# Patient Record
Sex: Male | Born: 1955 | Race: White | Hispanic: No | Marital: Married | Smoking: Never smoker
Health system: Southern US, Community
[De-identification: ages and names within clinical notes are randomized; demographics above are authoritative.]

## PROBLEM LIST (undated history)

## (undated) DIAGNOSIS — I10 Essential (primary) hypertension: Secondary | ICD-10-CM

## (undated) DIAGNOSIS — F329 Major depressive disorder, single episode, unspecified: Secondary | ICD-10-CM

## (undated) DIAGNOSIS — E7800 Pure hypercholesterolemia, unspecified: Secondary | ICD-10-CM

## (undated) DIAGNOSIS — R1031 Right lower quadrant pain: Secondary | ICD-10-CM

## (undated) DIAGNOSIS — F32A Depression, unspecified: Secondary | ICD-10-CM

## (undated) HISTORY — DX: Pure hypercholesterolemia, unspecified: E78.00

## (undated) HISTORY — DX: Essential (primary) hypertension: I10

## (undated) HISTORY — DX: Depression, unspecified: F32.A

## (undated) HISTORY — PX: TONSILLECTOMY: SUR1361

## (undated) HISTORY — PX: LIPOMA EXCISION: SHX5283

## (undated) HISTORY — DX: Right lower quadrant pain: R10.31

## (undated) HISTORY — DX: Major depressive disorder, single episode, unspecified: F32.9

---

## 2000-06-16 ENCOUNTER — Emergency Department (HOSPITAL_COMMUNITY): Admission: EM | Admit: 2000-06-16 | Discharge: 2000-06-16 | Payer: Self-pay | Admitting: *Deleted

## 2006-07-11 HISTORY — PX: MANDIBLE SURGERY: SHX707

## 2011-04-27 ENCOUNTER — Ambulatory Visit (HOSPITAL_COMMUNITY)
Admission: RE | Admit: 2011-04-27 | Discharge: 2011-04-27 | Disposition: A | Discharge status: Home / Self Care | Payer: Managed Care, Other (non HMO) | Source: Ambulatory Visit | Attending: Emergency Medicine | Admitting: Emergency Medicine

## 2011-04-27 ENCOUNTER — Other Ambulatory Visit: Payer: Self-pay | Admitting: Emergency Medicine

## 2011-04-27 DIAGNOSIS — N4400 Torsion of testis, unspecified: Secondary | ICD-10-CM

## 2011-04-27 DIAGNOSIS — N508 Other specified disorders of male genital organs: Secondary | ICD-10-CM | POA: Insufficient documentation

## 2011-04-27 DIAGNOSIS — N509 Disorder of male genital organs, unspecified: Secondary | ICD-10-CM | POA: Insufficient documentation

## 2011-06-08 ENCOUNTER — Encounter (INDEPENDENT_AMBULATORY_CARE_PROVIDER_SITE_OTHER): Payer: Self-pay | Admitting: General Surgery

## 2011-06-16 ENCOUNTER — Ambulatory Visit (INDEPENDENT_AMBULATORY_CARE_PROVIDER_SITE_OTHER): Payer: Managed Care, Other (non HMO) | Admitting: General Surgery

## 2011-06-16 ENCOUNTER — Encounter (INDEPENDENT_AMBULATORY_CARE_PROVIDER_SITE_OTHER): Payer: Self-pay | Admitting: General Surgery

## 2011-06-16 VITALS — BP 138/82 | HR 60 | Temp 97.6°F | Resp 18 | Ht 69.0 in | Wt 163.1 lb

## 2011-06-16 DIAGNOSIS — IMO0002 Reserved for concepts with insufficient information to code with codable children: Secondary | ICD-10-CM

## 2011-06-16 DIAGNOSIS — S76219A Strain of adductor muscle, fascia and tendon of unspecified thigh, initial encounter: Secondary | ICD-10-CM

## 2011-06-16 DIAGNOSIS — K429 Umbilical hernia without obstruction or gangrene: Secondary | ICD-10-CM | POA: Insufficient documentation

## 2011-06-16 MED ORDER — IBUPROFEN 800 MG PO TABS: 800.0000 mg | ORAL_TABLET | Freq: Three times a day (TID) | ORAL | Status: AC

## 2011-06-16 NOTE — Patient Instructions (Signed)
Inguinal Strain Your exam shows you have an inguinal strain. This is also known as a pulled groin. This injury is usually due to a pull or partial tear to a muscle or tendon in the groin area. Most groin pulls take several weeks to heal completely. There may be pain with lifting your leg or walking during much of your recovery. Treatment for groin strains includes:  Rest and avoid lifting or performing activities that increase your pain.   Apply ice packs for 20-30 minutes every few hours to reduce pain and swelling over the next 2-3 days.   Medicine to reduce pain and inflammation is often prescribed.  HOME CARE INSTRUCTIONS  While most strains in the groin area will heal with rest, you should also watch for any signs of a more serious condition.  SEEK IMMEDIATE MEDICAL CARE IF:   You notice unusual swelling or bulging in the groin.   You have pain or swelling in the testicle.   Blood in your urine.   Marked increased pain.   Weakness or numbness of your leg or abdominal pain.  MAKE SURE YOU:   Understand these instructions.   Will watch your condition.   Will get help right away if you are not doing well or get worse.  Document Released: 08/04/2004 Document Revised: 03/09/2011 Document Reviewed: 11/01/2007 [facility] Patient Information 2012 [place], [place].  Diverticulosis Diverticulosis is a common condition that develops when small pouches (diverticula) form in the wall of the colon. The risk of diverticulosis increases with age. It happens more often in people who eat a low-fiber diet. Most individuals with diverticulosis have no symptoms. Those individuals with symptoms usually experience abdominal pain, constipation, or loose stools (diarrhea). HOME CARE INSTRUCTIONS   Increase the amount of fiber in your diet as directed by your caregiver or dietician. This may reduce symptoms of diverticulosis.   Your caregiver may recommend taking a dietary fiber supplement.   Drink at  least 6 to 8 glasses of water each day to prevent constipation.   Try not to strain when you have a bowel movement.   Your caregiver may recommend avoiding nuts and seeds to prevent complications, although this is still an uncertain benefit.   Only take over-the-counter or prescription medicines for pain, discomfort, or fever as directed by your caregiver.  FOODS WITH HIGH FIBER CONTENT INCLUDE:  Fruits. Apple, peach, pear, tangerine, raisins, prunes.   Vegetables. Brussels sprouts, asparagus, broccoli, cabbage, carrot, cauliflower, romaine lettuce, spinach, summer squash, tomato, winter squash, zucchini.   Starchy Vegetables. Baked beans, kidney beans, lima beans, split peas, lentils, potatoes (with skin).   Grains. Whole wheat bread, brown rice, bran flake cereal, plain oatmeal, white rice, shredded wheat, bran muffins.  SEEK IMMEDIATE MEDICAL CARE IF:   You develop increasing pain or severe bloating.   You have an oral temperature above 102 F (38.9 C), not controlled by medicine.   You develop vomiting or bowel movements that are bloody or black.  Document Released: 03/24/2004 Document Revised: 03/09/2011 Document Reviewed: 11/25/2009 [facility] Patient Information 2012 [place], [place].

## 2011-06-16 NOTE — Progress Notes (Signed)
Patient ID: William Nunez, male   DOB: 03-04-1956, 55 y.o.   MRN: [id number]  Chief Complaint  Patient presents with  . New Evaluation    eval of groin pain possible hernia     HPI William Nunez is a 55 y.o. male.  HPI 55 year old Caucasian male referred by his doctor for evaluation of right inguinal pain. The patient states that he started having problems in October 2012. He had intercourse with his wife and the following morning he noticed right inguinal and testicular discomfort. He went to an urgent care. He was told he did not have testicular torsion. There was concerns for possible epididymitis. He was placed on antibiotics. A subsequent ultrasound was done which was negative except for epididymal cyst. He did not have any relief of his discomfort with the doxycycline. He ended up seeing his primary care physician as well. He was placed on meloxicam without any relief. At this point he went back to the urgent care center where he states x-rays as well as a CT scan were ordered. There was evidence of gallstones, a small umbilical hernia, and no mention of an inguinal hernia. He was placed on gabapentin. He states that since he has started taking the gabapentin his discomfort has improved but not resolved.  He describes the discomfort as a sharp burning pain. It is constant. Initially it was a 7/10. He did have some numbness in his right anterior thigh as well. He has tried to avoid heavy activities. He did get some improvement with gabapentin.  He denies any abdominal pain, bloating, nausea, postprandial pain.  He has questions about gallstones and diverticulosis.   Past Medical History  Diagnosis Date  . Hypertension   . Hypercholesteremia     Past Surgical History  Procedure Date  . Tonsillectomy   . Lipoma excision     several  . Mandible surgery 2008    removal polyp    Family History  Problem Relation Age of Onset  . Heart attack Father   . Stroke Mother   .  Diabetes Mother     Social History History  Substance Use Topics  . Smoking status: Never Smoker   . Smokeless tobacco: Never Used  . Alcohol Use: No    Allergies  Allergen Reactions  . Hycodan (Hydrocodone-Homatropine)   . Lisinopril   . Prozac (Fluoxetine Hcl)     Current Outpatient Prescriptions  Medication Sig Dispense Refill  . fish oil-omega-3 fatty acids 1000 MG capsule Take 2 g by mouth daily.        Marland Kitchen gabapentin (NEURONTIN) 300 MG capsule       . losartan (COZAAR) 25 MG tablet Take 25 mg by mouth daily.        . Multiple Vitamins-Minerals (MULTIVITAMIN PO) Take by mouth daily.        Marland Kitchen ibuprofen (MOTRIN IB) 800 MG tablet Take 1 tablet (800 mg total) by mouth 3 (three) times daily.  30 tablet  0    Review of Systems Review of Systems  Constitutional: Negative for fever, chills, appetite change, fatigue and unexpected weight change.  HENT: Negative for nosebleeds, neck pain and neck stiffness.   Eyes: Negative for photophobia and visual disturbance.       +glasses  Respiratory: Negative for chest tightness, shortness of breath and wheezing.   Cardiovascular: Negative for chest pain and palpitations.  Gastrointestinal: Negative for nausea, abdominal pain, diarrhea, constipation and abdominal distention.  Genitourinary: Negative for dysuria, urgency, hematuria,  decreased urine volume, discharge, difficulty urinating and penile pain.       +nocturia  Musculoskeletal: Negative for joint swelling and gait problem.  Skin: Negative.   Neurological: Negative.   Hematological: Negative.   Psychiatric/Behavioral: Negative.     Blood pressure 138/82, pulse 60, temperature 97.6 F (36.4 C), temperature source Temporal, resp. rate 18, height 5\' 9"  (1.753 m), weight 163 lb 2 oz (73.993 kg).  Physical Exam Physical Exam  Vitals reviewed. Constitutional: He is oriented to person, place, and time. He appears well-developed and well-nourished. No distress.  HENT:  Head:  Normocephalic and atraumatic.  Eyes: Conjunctivae are normal. No scleral icterus.  Neck: Neck supple. No tracheal deviation present. No thyromegaly present.  Cardiovascular: Normal rate, regular rhythm and normal heart sounds.   Pulmonary/Chest: Effort normal and breath sounds normal. No respiratory distress. He has no wheezes.  Abdominal: Soft. Bowel sounds are normal. He exhibits no distension. There is no tenderness. A hernia is present. Hernia confirmed negative in the right inguinal area and confirmed negative in the left inguinal area.       Small 1.5cm umbilical hernia- reducible  Genitourinary: Testes normal and penis normal.  Musculoskeletal: Normal range of motion. He exhibits no edema and no tenderness.  Lymphadenopathy:    He has no cervical adenopathy.       Right: No inguinal adenopathy present.       Left: No inguinal adenopathy present.  Neurological: He is alert and oriented to person, place, and time. He exhibits normal muscle tone.  Skin: Skin is warm and dry. He is not diaphoretic.  Psychiatric: He has a normal mood and affect. His behavior is normal. Judgment and thought content normal.    Data Reviewed RADIOLOGICAL STUDIES: I have personally reviewed the radiological exams (CT abdomen) myself I reviewed the accompanying report- gallstones, umbilical hernia with fat, diverticulosis.  Ct did not go down thru groin  Dr Tiburcio Pea' notes   Assessment    Right inguinal strain Asymptomatic cholelithiasis Diverticulosis    Plan    I do not appreciate an inguinal hernia on exam. His symptoms are most consistent with an inguinal strain. We discussed the signs and symptoms of inguinal strain. He was given Agricultural engineer. I explained that he may have a very small inguinal hernia that is clinically undetectable. The CT scan did not go down through the pelvis.  We discussed management of inguinal strain. My recommendation was to continue with the gabapentin since he  is improvement with it.  I advised him to avoid strenuous activities for at least another 4 weeks. He was given a prescription for 800 mg of Motrin 3 times a day for one week.  We also discussed gallstone disease. We also discussed the signs and symptoms of cholecystitis. He was given educational material. Right now it appears he is asymptomatic from his gallstones. I did not recommend cholecystectomy since he is asymptomatic.  With respect to his diverticulosis, we discussed the etiology of diverticular disease and how it differs from diverticulitis. We discussed the importance of a high fiber diet. He was given Agricultural engineer.  I will follow with him in 4-5 weeks. One option we discussed was that if he had ongoing pain in his groin, we could perform a diagnostic laparoscopy with a possible laparoscopic right inguinal hernia repair. We will repair his umbilical hernia at that time.  Mary Sella. Andrey Campanile, MD, FACS General, Bariatric, & Minimally Invasive Surgery [facility], [place]  Gaynelle Adu M 06/16/2011, 11:14 AM

## 2011-06-26 ENCOUNTER — Ambulatory Visit (INDEPENDENT_AMBULATORY_CARE_PROVIDER_SITE_OTHER): Payer: Managed Care, Other (non HMO)

## 2011-06-26 DIAGNOSIS — K602 Anal fissure, unspecified: Secondary | ICD-10-CM

## 2011-06-26 DIAGNOSIS — K625 Hemorrhage of anus and rectum: Secondary | ICD-10-CM

## 2011-06-26 DIAGNOSIS — R1084 Generalized abdominal pain: Secondary | ICD-10-CM

## 2011-07-20 ENCOUNTER — Encounter (INDEPENDENT_AMBULATORY_CARE_PROVIDER_SITE_OTHER): Payer: Self-pay | Admitting: General Surgery

## 2011-07-20 ENCOUNTER — Ambulatory Visit (INDEPENDENT_AMBULATORY_CARE_PROVIDER_SITE_OTHER): Payer: Managed Care, Other (non HMO) | Admitting: General Surgery

## 2011-07-20 VITALS — BP 150/82 | HR 70 | Temp 97.9°F | Resp 18 | Ht 69.5 in | Wt 155.0 lb

## 2011-07-20 DIAGNOSIS — K429 Umbilical hernia without obstruction or gangrene: Secondary | ICD-10-CM

## 2011-07-20 DIAGNOSIS — R1031 Right lower quadrant pain: Secondary | ICD-10-CM

## 2011-07-20 DIAGNOSIS — K602 Anal fissure, unspecified: Secondary | ICD-10-CM

## 2011-07-20 MED ORDER — AMBULATORY NON FORMULARY MEDICATION: 1.0000 "application " | Freq: Two times a day (BID) | Status: DC

## 2011-07-20 NOTE — Progress Notes (Signed)
Chief Complaint  Patient presents with  . Follow-up    Right inguinal strain - groin pain    HISTORY:  The patient is a 56 year old Caucasian male who I initially saw in early December for right inguinal pain radiating into his scrotum. The discomfort and pain had been going on for at least a month. It developed the morning after the patient had intercourse with his wife. He had been treated for epididymitis which was unsuccessful, had had a scrotal ultrasound which was unrevealing, as well as a CT scan of the abdomen which only demonstrated an umbilical hernia and gallstones. At that time we diagnosed the patient with inguinal strain.  He comes in today for followup. He states that he is no better. He still has daily discomfort in his right inguinal area radiating into his scrotum. He says that it tingles. He also describes it as a pressure sensation. It is interfering with his work. He describes it as an 8/10. He denies any diarrhea, dysuria, urinary hesitancy, weak stream. He did have a problem with constipation since his last visit and developed rectal pain. He was diagnosed with an anal fissure. He also saw Dr. Jeani Hawking for evaluation. He has not been prescribed diltiazem or nifedipine ointment. He states that the rectal discomfort is a little bit improved however it is still present. He states that he is now having a bowel movement every day. He denies any fecal incontinence. He denies any bloating, cramping, melena or hematochezia. He will occasionally have some numbness in his right lower leg. He is very frustrated that he hasn't gotten any improvement with his right inguinal discomfort. He states that it feels like something is pressing against the area.  Past Medical History  Diagnosis Date  . Hypertension   . Hypercholesteremia   . Right inguinal pain      Past Surgical History  Procedure Date  . Tonsillectomy   . Lipoma excision     several  . Mandible surgery 2008   removal polyp    Current Outpatient Prescriptions  Medication Sig Dispense Refill  . AMBULATORY NON FORMULARY MEDICATION Place 1 application rectally 2 (two) times daily. Medication Name: Nifedipine 2% Ointment  30 g  1  . fish oil-omega-3 fatty acids 1000 MG capsule Take 2 g by mouth daily.        Marland Kitchen gabapentin (NEURONTIN) 300 MG capsule       . losartan (COZAAR) 25 MG tablet Take 25 mg by mouth daily.        . Multiple Vitamins-Minerals (MULTIVITAMIN PO) Take by mouth daily.           Allergies  Allergen Reactions  . Hycodan (Hydrocodone-Homatropine) Nausea Only and Other (See Comments)    Made patient feel "out of it"  . Prozac (Fluoxetine Hcl) Other (See Comments)    Made patient feel "weird"  . Lisinopril     Patient unsure of reaction     Family History  Problem Relation Age of Onset  . Heart attack Father   . Stroke Mother   . Diabetes Mother      History   Social History  . Marital Status: Married    Spouse Name: N/A    Number of Children: N/A  . Years of Education: N/A   Social History Main Topics  . Smoking status: Never Smoker   . Smokeless tobacco: Never Used  . Alcohol Use: No  . Drug Use: No  . Sexually Active: None  Other Topics Concern  . None   Social History Narrative  . None     REVIEW OF SYSTEMS - PERTINENT POSITIVES ONLY:  A comprehensive 12 point ROS was performed- all are negative except for what is mentioned in the HPI  EXAM: Filed Vitals:   07/20/11 1158  BP: 150/82  Pulse: 70  Temp: 97.9 F (36.6 C)  Resp: 18    HEENT: normocephalic; pupils equal and reactive; sclerae clear; dentition good; mucous membranes moist NECK:  ; symmetric on extension; no palpable anterior or posterior cervical lymphadenopathy; no supraclavicular masses; no tenderness CHEST: clear to auscultation bilaterally without rales, rhonchi, or wheezes CARDIAC: regular rate and rhythm without significant murmur; peripheral pulses are  full EXT:  non-tender without edema; no deformity NEURO: no gross focal deficits; no sign of tremor ABD:   Soft, nt, nd. About 2.5 cm reducible umbilical hernia GU:  No testicular masses; no scrotal masses, no tenderness on palpation. No varicoele. No evidence of inguinal hernia. No enlarged inguinal ring PSYCH: Alert, NAD, oriented, appropriate RECTAL: DRE deferred. No ext hemorrhoids. No prolapsed int hemorrhoid. No evid of ext fissure   LABORATORY RESULTS: See Epic for most recent results   RADIOLOGY RESULTS: See Epic or I-Site for most recent results  I reviewed my last note, Dr Haywood Pao note, pt's scrotal ultrasound.  IMPRESSION: Umbilical hernia Right inguinal pain Probable anal fissure   PLAN:  I am unsure as to the exact etiology of the patient's continued right inguinal pain. Again this sounds like a inguinal strain; however, it should have improved by now. I reviewed his ultrasound and symptoms with Dr. Sherron Monday at [facility] urology. He did not believe the epididymal cyst was responsible for the patient's right groin pain.  At this point I offered the patient a diagnostic laparoscopy to confirm for sure whether not he has an inguinal hernia on the right side. I told him while I cannot palpate one on physical exam I cannot completely exclude it. I told him the best and most sensitive procedure for confirming the presence of a hernia is a diagnostic laparoscopy. We will have the added benefit of being able to repair his umbilical hernia at the conclusion of the procedure. I informed him I would also look in his pelvic area and right abdominal area to look for any other abnormalities. We discussed the possibility of not being able to find any particular reason for his right inguinal pain.   I described the procedure in detail.  The patient was given educational material. We discussed the risks and benefits including but not limited to bleeding, infection, chronic inguinal pain,  nerve entrapment, hernia recurrence, mesh complications, hematoma formation, urinary retention, injury to the testicles or the ovaries, numbness in the groin, blood clots, injury to the surrounding structures, and anesthesia risk. We also discussed the typical post operative recovery course, including no heavy lifting for 4-6 weeks. I explained that the likelihood of improvement of their symptoms is 30-50%.  With respect to his rectal pain his symptoms are consistent with an anal fissure. However I did not see a gross fissure on external visual inspection. He states when her previous doctors have inserted a finger in his rectum he came off the table which is consistent with an anal fissure. We discussed the etiology and management of anal fissures. He was given Agricultural engineer. We will institute nonoperative management first. He was given a prescription for nifedipine ointment.   We will plan on doing a diagnostic  laparoscopy, possible right inguinal hernia repair with mesh, umbilical hernia repair with possible mesh to try to find the source of his right groin pain. He understands that the laparoscopy may not reveal the etiology of his right groin pain.  Mary Sella. Andrey Campanile, MD, FACS General, Bariatric, & Minimally Invasive Surgery [facility]      Visit Diagnoses: 1. Umbilical hernia   2. Right inguinal pain   3. Anal fissure     Primary Care Physician: Abe People, MD

## 2011-07-20 NOTE — Patient Instructions (Signed)
Anal Fissure, Adult An anal fissure is a small tear or crack in the skin around the anus. Bleeding from a fissure usually stops on its own within a few minutes. However, bleeding will often reoccur with each bowel movement until the crack heals.  CAUSES   Passing large, hard stools.   Frequent diarrheal stools.   Constipation.   Inflammatory bowel disease (Crohn's disease or ulcerative colitis).   Infections.   Anal sex.  SYMPTOMS   Small amounts of blood seen on your stools, on toilet paper, or in the toilet after a bowel movement.   Rectal bleeding.   Painful bowel movements.   Itching or irritation around the anus.  DIAGNOSIS Your caregiver will examine the anal area. An anal fissure can usually be seen with careful inspection. A rectal exam may be performed and a short tube (anoscope) may be used to examine the anal canal. TREATMENT   You may be instructed to take fiber supplements. These supplements can soften your stool to help make bowel movements easier.   Sitz baths may be recommended to help heal the tear. Do not use soap in the sitz baths.   A medicated cream or ointment may be prescribed to lessen discomfort.  HOME CARE INSTRUCTIONS   Maintain a diet high in fruits, whole grains, and vegetables. Avoid constipating foods like bananas and dairy products.   Take sitz baths as directed by your caregiver.   Drink enough fluids to keep your urine clear or pale yellow.   Only take over-the-counter or prescription medicines for pain, discomfort, or fever as directed by your caregiver. Do not take aspirin as this may increase bleeding.   Do not use ointments containing numbing medications (anesthetics) or hydrocortisone. They could slow healing.  SEEK MEDICAL CARE IF:   Your fissure is not completely healed within 3 days.   You have further bleeding.   You have a fever.   You have diarrhea mixed with blood.   You have pain.   Your problem is getting worse  rather than better.  MAKE SURE YOU:   Understand these instructions.   Will watch your condition.   Will get help right away if you are not doing well or get worse.  Document Released: 06/27/2005 Document Revised: 03/09/2011 Document Reviewed: 12/12/2010 [employer] Patient Information 2012 [employer]. 

## 2011-07-21 ENCOUNTER — Encounter (INDEPENDENT_AMBULATORY_CARE_PROVIDER_SITE_OTHER): Payer: Self-pay | Admitting: Emergency Medicine

## 2011-08-02 ENCOUNTER — Other Ambulatory Visit (HOSPITAL_COMMUNITY): Payer: Managed Care, Other (non HMO)

## 2011-08-06 ENCOUNTER — Ambulatory Visit (INDEPENDENT_AMBULATORY_CARE_PROVIDER_SITE_OTHER): Payer: Managed Care, Other (non HMO)

## 2011-08-06 DIAGNOSIS — R634 Abnormal weight loss: Secondary | ICD-10-CM

## 2011-08-06 DIAGNOSIS — N509 Disorder of male genital organs, unspecified: Secondary | ICD-10-CM

## 2011-08-06 DIAGNOSIS — R109 Unspecified abdominal pain: Secondary | ICD-10-CM

## 2011-08-12 ENCOUNTER — Encounter (HOSPITAL_COMMUNITY): Admission: RE | Payer: Self-pay | Source: Ambulatory Visit

## 2011-08-12 ENCOUNTER — Ambulatory Visit (HOSPITAL_COMMUNITY)
Admission: RE | Admit: 2011-08-12 | Payer: Managed Care, Other (non HMO) | Source: Ambulatory Visit | Admitting: General Surgery

## 2011-08-12 SURGERY — LAPAROSCOPY, DIAGNOSTIC
Anesthesia: General

## 2011-08-14 ENCOUNTER — Ambulatory Visit (INDEPENDENT_AMBULATORY_CARE_PROVIDER_SITE_OTHER): Payer: Managed Care, Other (non HMO) | Admitting: Emergency Medicine

## 2011-08-14 VITALS — BP 128/76 | HR 80 | Temp 98.0°F | Resp 16 | Ht 68.5 in | Wt 150.4 lb

## 2011-08-14 DIAGNOSIS — F329 Major depressive disorder, single episode, unspecified: Secondary | ICD-10-CM

## 2011-08-14 DIAGNOSIS — F32A Depression, unspecified: Secondary | ICD-10-CM

## 2011-08-14 DIAGNOSIS — N509 Disorder of male genital organs, unspecified: Secondary | ICD-10-CM

## 2011-08-14 DIAGNOSIS — R102 Pelvic and perineal pain: Secondary | ICD-10-CM

## 2011-08-14 DIAGNOSIS — N5089 Other specified disorders of the male genital organs: Secondary | ICD-10-CM

## 2011-08-14 NOTE — Progress Notes (Signed)
  Subjective:    Patient ID: NIKAI QUEST, male    DOB: 08-Dec-1955, 56 y.o.   MRN: [id number]  HPI  Patient enters for followup on persistent pain in his pelvic area and into the right testicle. 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 This does not appear to cause any nerve impingement and should not be the source of his discomfort. He also had recently assessment discomfort with his vision but no other specific complaints. His lab work has returned as completely normal including a normal sedimentation rate and CRP. Review of Systems no new symptoms except for some recent discomfort with his vision     Objective:   Physical Exam his physical exam is unchanged pupils are equal and reactive to light. There is no cervical logical exam is within normal limits. His abdomen is soft without discomfort        Assessment & Plan:  Patient is experiencing persistent pain into his right testicle and groin. No etiology has been found for this discomfort. He hasn't been seen by Dr. Audley Hose and GI evaluation has been normal. He has been seen by the surgeon who recommended exploratory surgery but he declined. Plan at present is to proceed with an MRI the brain. Following this we[place] get [place] neurological [place] in  [place] as she has become depressed frustrated since no source for his pain can be elicited. The plan is to proceed with an MRI of the brain. I will discuss with Dr. Vear Clock the reading radiologist for his MRI and see if any portions of the pelvis were visualized. Also given the number for extubate to see him and get some advice regarding his recent depression the source of his depression seems to be the inability to find the source of this discomfort he feels in his pelvic area

## 2011-08-14 NOTE — Patient Instructions (Signed)
Patient is being referred for an MRI of the brain. I am going to speak with a Dr. Vear Clock at [facility] to be sure we understand how far down in the pelvis they were able to scan. I suggested he see Dr. Karmen Bongo to be evaluated for depression related to his persistent pelvic pain. I told the patient I would talk with him after I spoke with Dr. Vear Clock and left her know about the findings on his MRI. All this was discussed with he and his wife and they are in agreement with the treatment plan.

## 2011-08-16 ENCOUNTER — Telehealth: Payer: Self-pay

## 2011-08-16 NOTE — Telephone Encounter (Signed)
I called patient. Will schedule MRI of pelvis and brain. I had Darl Pikes call to put the chart in my box

## 2011-08-16 NOTE — Telephone Encounter (Signed)
Patient would like for Dr. Cleta Alberts to contact him concerning a follow-up with [facility] on his MRI. He really would like for Dr. Cleta Alberts to contact him.

## 2011-08-17 ENCOUNTER — Other Ambulatory Visit: Payer: Self-pay | Admitting: Emergency Medicine

## 2011-08-17 DIAGNOSIS — G8929 Other chronic pain: Secondary | ICD-10-CM

## 2011-08-17 DIAGNOSIS — H539 Unspecified visual disturbance: Secondary | ICD-10-CM

## 2011-08-24 ENCOUNTER — Ambulatory Visit (INDEPENDENT_AMBULATORY_CARE_PROVIDER_SITE_OTHER): Payer: Managed Care, Other (non HMO) | Admitting: Emergency Medicine

## 2011-08-24 VITALS — BP 129/78 | HR 73 | Temp 98.0°F | Resp 16

## 2011-08-24 DIAGNOSIS — N509 Disorder of male genital organs, unspecified: Secondary | ICD-10-CM

## 2011-08-24 DIAGNOSIS — R102 Pelvic and perineal pain: Secondary | ICD-10-CM

## 2011-08-24 DIAGNOSIS — R1084 Generalized abdominal pain: Secondary | ICD-10-CM

## 2011-08-24 DIAGNOSIS — M25559 Pain in unspecified hip: Secondary | ICD-10-CM

## 2011-08-24 NOTE — Progress Notes (Signed)
  Subjective:    Patient ID: SAKAI WOLFORD, male    DOB: 12-12-1955, 56 y.o.   MRN: [id number]  HPI patient enters for followup of his pelvic pain. He's been to see a therapist to discuss his anxiety over this chronic pain he has been experiencing. He is under a lot of stress and is having a lot of anxiety over our inability to diagnosis pelvic pain. He has subsequently had his MRI of the pelvis and this is normal. He still waiting his referral to neurology for evaluation. He would like to hold off on his head MRI until he has seen the neurologist.    Review of Systems     Objective:   Physical Exam abdominal exam reveals a small reducible umbilical hernia. There are no other abnormality MRI was reviewed and is normal.        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 Plan is to continue with neurological evaluation and her daughter and his MRI of the brain at the present time.

## 2011-08-30 ENCOUNTER — Ambulatory Visit (HOSPITAL_COMMUNITY)
Admission: RE | Admit: 2011-08-30 | Discharge: 2011-08-30 | Disposition: A | Discharge status: Home / Self Care | Payer: Managed Care, Other (non HMO) | Source: Ambulatory Visit | Attending: Family Medicine | Admitting: Family Medicine

## 2011-08-30 ENCOUNTER — Telehealth: Payer: Self-pay | Admitting: Family Medicine

## 2011-08-30 ENCOUNTER — Ambulatory Visit (INDEPENDENT_AMBULATORY_CARE_PROVIDER_SITE_OTHER): Payer: Managed Care, Other (non HMO) | Admitting: Family Medicine

## 2011-08-30 DIAGNOSIS — M79609 Pain in unspecified limb: Secondary | ICD-10-CM

## 2011-08-30 DIAGNOSIS — M79669 Pain in unspecified lower leg: Secondary | ICD-10-CM

## 2011-08-30 DIAGNOSIS — H5710 Ocular pain, unspecified eye: Secondary | ICD-10-CM

## 2011-08-30 DIAGNOSIS — M79604 Pain in right leg: Secondary | ICD-10-CM

## 2011-08-30 DIAGNOSIS — M79605 Pain in left leg: Secondary | ICD-10-CM

## 2011-08-30 NOTE — Telephone Encounter (Signed)
Spoke with pt and advised message from Dr. Neva Seat. Pt understood and will recheck if not doing better.

## 2011-08-30 NOTE — Progress Notes (Signed)
Subjective:    Patient ID: William Nunez, male    DOB: Dec 08, 1955, 56 y.o.   MRN: [id number]  HPI William Nunez is a 57 y.o. male, recently followed by Dr. Cleta Alberts for R inguinal pain, umbilical hernia, and pelvic pain of unknown etiology, with MRI recently without source for pain per notes. Pt was seen by General surgery last month for these symptoms, but diagnostic laparoscopy was cancelled. Still feels same shooting pains in legs and pains in hands .Has shooting pains in eyes at times - left eye for past few weeks, cold chill sensations in head. No amaurosis fugax or change in floaters. No change in visual acuity.    Neurology eval pending, had planned on waiting for MRI brain, but wants to order now - wants to have done at [facility] - late afternoon appointment.  Today acute symptom is a pain/muscle soreness in left calf area.  Started about 3 days ago. No known injury or abrasion. Feels like a vein and feels swollen in area.  No chest pain, no shortness of breath.  Sits a lot at work, but no recent air travel or prolonged car travel, no hx of DVT or blood clotting disorders.   Review of Systems  Constitutional: Negative for fever, chills and activity change.  Eyes: Positive for pain. Negative for photophobia, discharge, redness, itching and visual disturbance.  Respiratory: Negative for cough, chest tightness and shortness of breath.   Cardiovascular: Negative for chest pain and palpitations.  Musculoskeletal: Positive for myalgias and gait problem. Negative for joint swelling.  Skin: Positive for color change.       Redness along vein of left calf.  Hematological: Does not bruise/bleed easily.   .     Objective:   Physical Exam  Constitutional: He is oriented to person, place, and time. He appears well-developed and well-nourished.  HENT:  Head: Normocephalic and atraumatic.  Eyes: Conjunctivae and EOM are normal. Pupils are equal, round, and reactive to light.       No  nystagmus   Neck: Normal range of motion.  Cardiovascular: Normal rate, normal heart sounds and intact distal pulses.   No murmur heard. Pulmonary/Chest: Effort normal and breath sounds normal. No stridor. No respiratory distress.  Musculoskeletal:       Left lower leg: He exhibits tenderness.       Legs: Neurological: He is alert and oriented to person, place, and time. He has normal strength.  Skin: Skin is warm and dry. There is erythema.  Psychiatric: His speech is normal and behavior is normal. His affect is blunt.    Calf circumference at 15 cm below patella : R 35cm, L 34 cm    Assessment & Plan:  . 1. Calf pain  Lower Extremity Venous Duplex Left  2. Painful legs and moving toes    3. Eye pain     Suspect left calf pain/erythema from superficial thrombophlebitis, but will check doppler/us today to r/o DVT.  Leg and hand pains unchanged - see prior workup.  Admits to L eye shooting pains and chill sensation in head.  Neuro eval pending, but would like CT head ordered now.  Will relay this to Dr. Cleta Alberts to work on next step in eval.    12:30pm addendum: *PRELIMINARY RESULTS*  Vascular Ultrasound  Left lower extremity venous duplex has been completed. Preliminary findings: Left= No evidence of DVT or baker's cyst. Superficial thrombus is seen in a branch of the greater saphenous vein in the  upper calf area.    Will call pt w results - ibuprofen otc hot compresses to area.  rom ok. Recheck next few days if not improving, sooner or to ER if more calf pain, swelling, chest pain, or shortness of breath.

## 2011-08-30 NOTE — Progress Notes (Signed)
*  PRELIMINARY RESULTS* Vascular Ultrasound Left lower extremity venous duplex has been completed.  Preliminary findings: Left= No evidence of DVT or baker's cyst. Superficial thrombus is seen in a branch of the greater saphenous vein in the upper calf area.  Farrel Demark RDMS 08/30/2011, 11:24 AM

## 2011-09-01 ENCOUNTER — Other Ambulatory Visit: Payer: Self-pay | Admitting: Emergency Medicine

## 2011-09-01 ENCOUNTER — Telehealth: Payer: Self-pay

## 2011-09-01 DIAGNOSIS — H539 Unspecified visual disturbance: Secondary | ICD-10-CM

## 2011-09-01 NOTE — Telephone Encounter (Signed)
pts wife called back and states her spouse thinks he is having a 3rd MRI, but has not heard anything back. Wife would like to discuss the necessity of another MRI

## 2011-09-01 NOTE — Telephone Encounter (Signed)
pts wife, Britta Mccreedy called, she wants to talk with dr Cleta Alberts re: anxiety and depression issues. She thinks pts issues are getting worse and wants to know if a light dose of anti-anxiety and/or anti-depressant med be called into [facility] on [address]. Please call wife at [phone]

## 2011-09-02 NOTE — Telephone Encounter (Signed)
To Dr. Cleta Alberts for review

## 2011-09-02 NOTE — Telephone Encounter (Signed)
Dr. Cleta Alberts,   Did you get this message?

## 2011-09-08 ENCOUNTER — Telehealth: Payer: Self-pay

## 2011-09-08 NOTE — Telephone Encounter (Signed)
Pt is needing to talk to dr Cleta Alberts about his referral to [facility] neurologic -[facility] called today and stated that they will not be able to see patient and pt is wanting a neurologic referral

## 2011-09-09 NOTE — Telephone Encounter (Signed)
LMOM to CB. 

## 2011-09-10 NOTE — Telephone Encounter (Signed)
Pt states that [facility] called and said that they will not be able to see him because they think he should be seen by a urologist. Please advise

## 2012-09-13 IMAGING — US US SCROTUM
1 series · 14 of 25 positions shown · non-contrast
Comparison: None.

CLINICAL DATA: Right scrotal pain, evaluate for torsion

ULTRASOUND OF SCROTUM
TECHNIQUE: Complete ultrasound examination of the testicles,
epididymis, and other scrotal structures was performed.

[Series 1: us scrotum · 0.07mm/px · 14 of 45 slices shown]
[im 1/45]
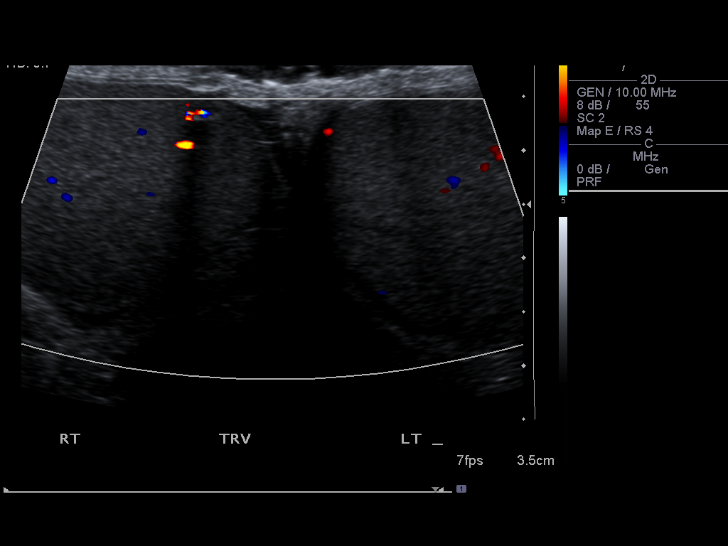
[im 4/45]
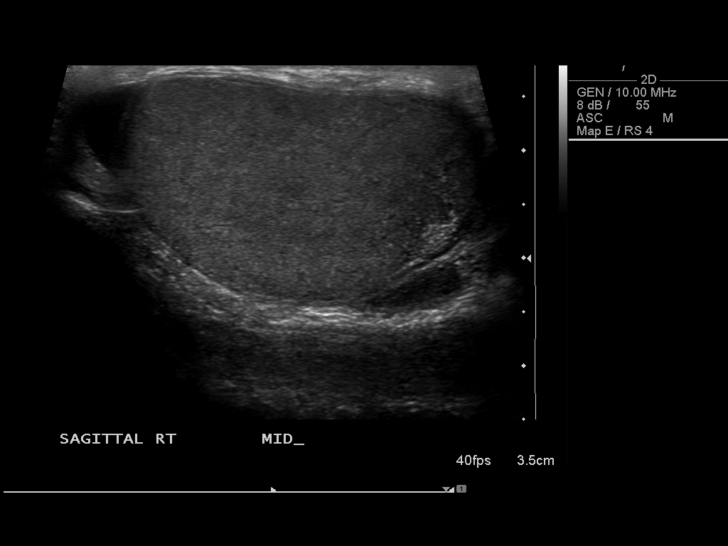
[im 8/45]
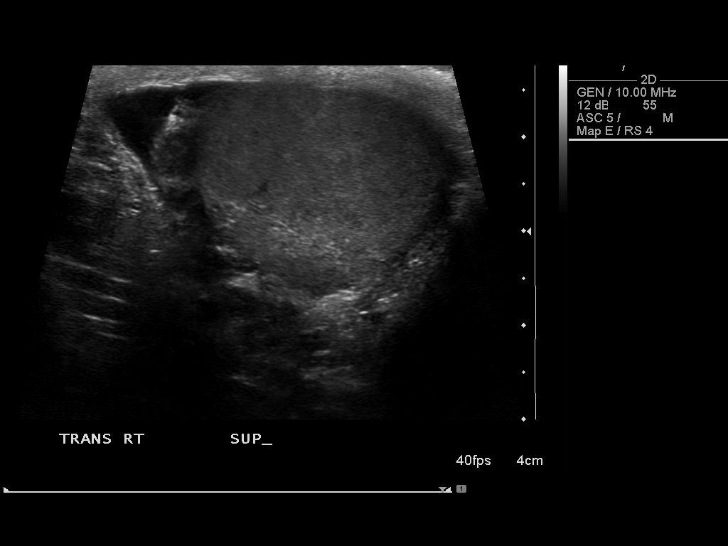
[im 12/45]
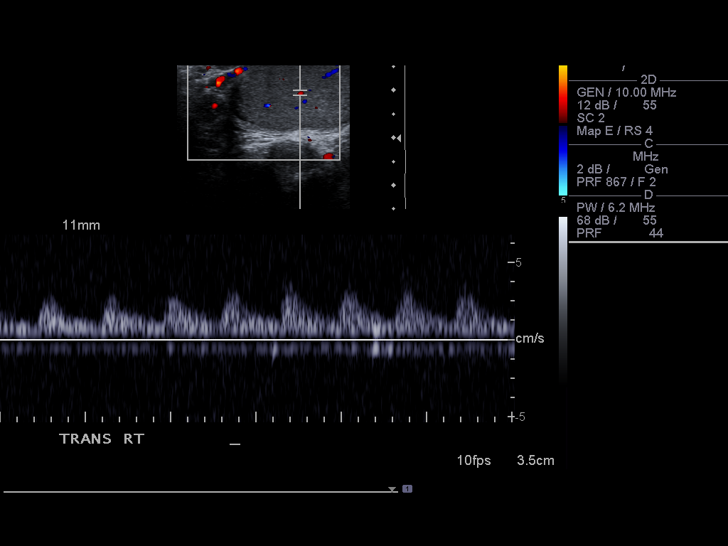
[im 15/45]
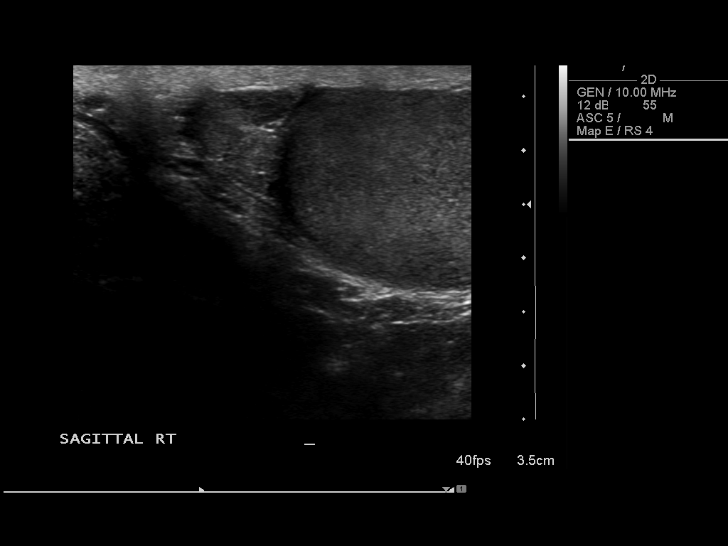
[im 17/45]
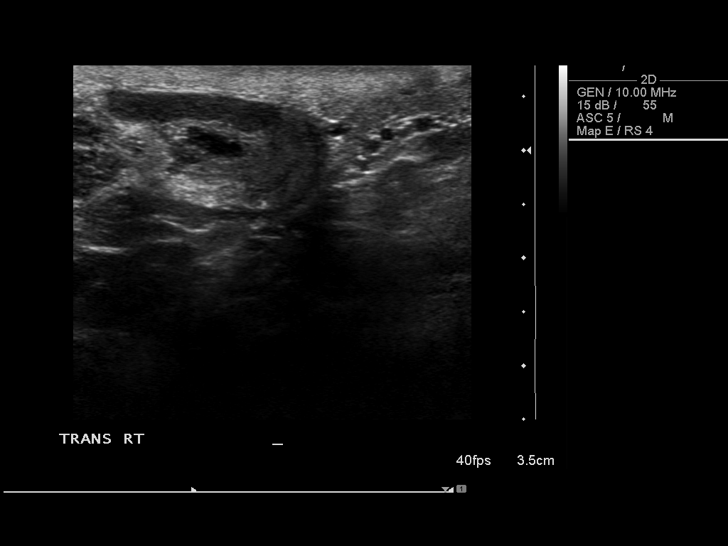
[im 21/45]
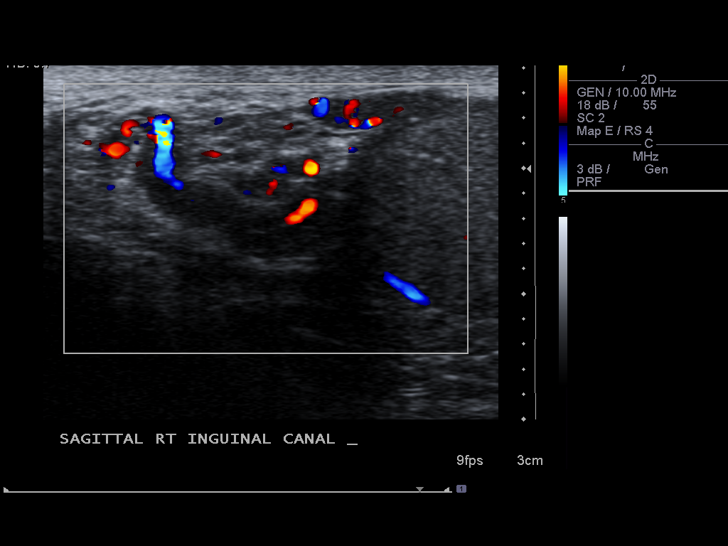
[im 24/45]
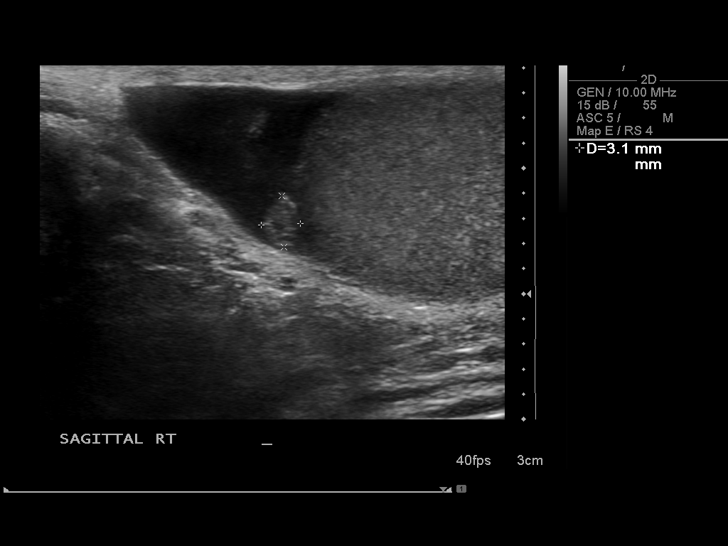
[im 28/45]
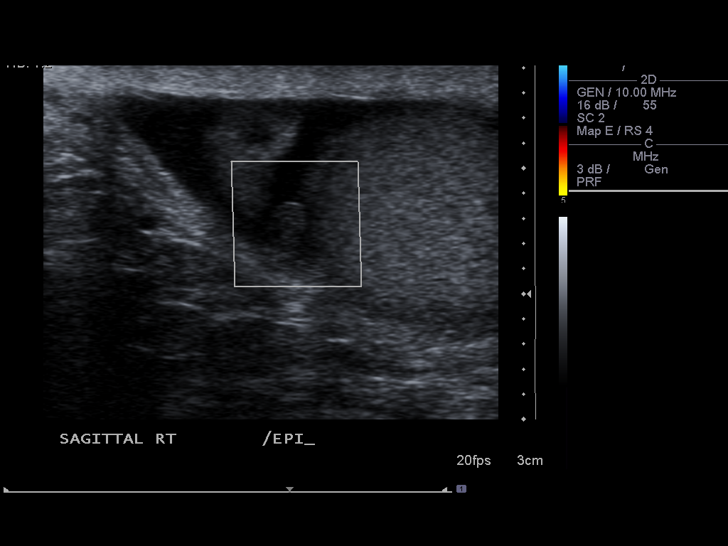
[im 30/45]
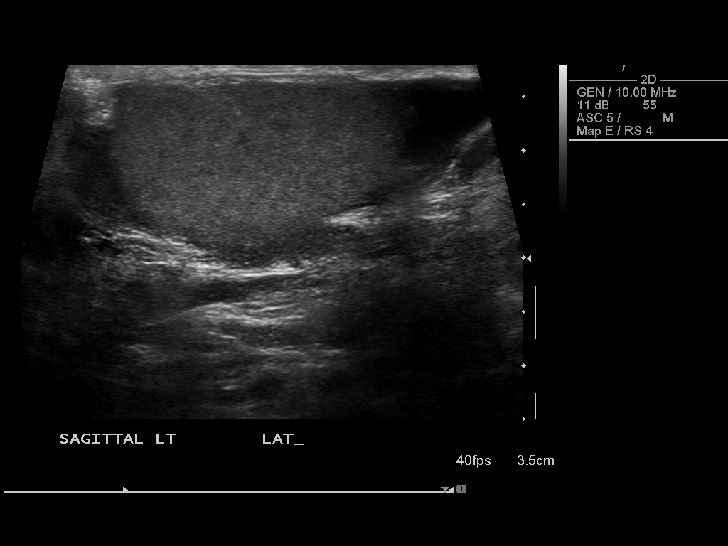
[im 34/45]
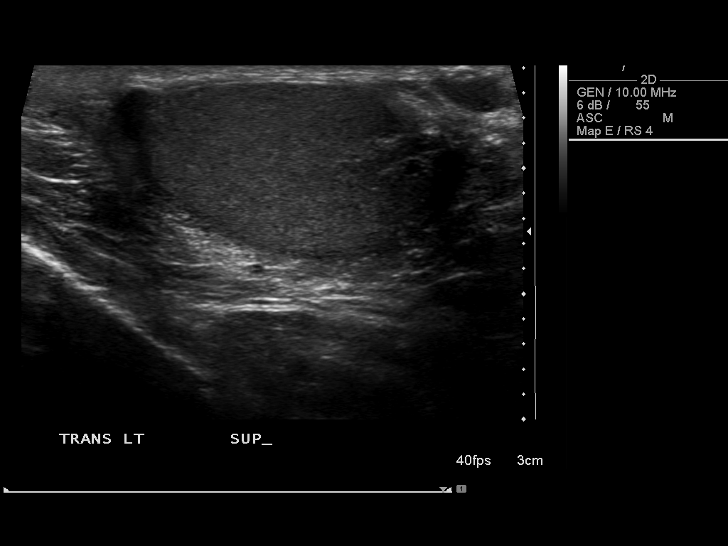
[im 37/45]
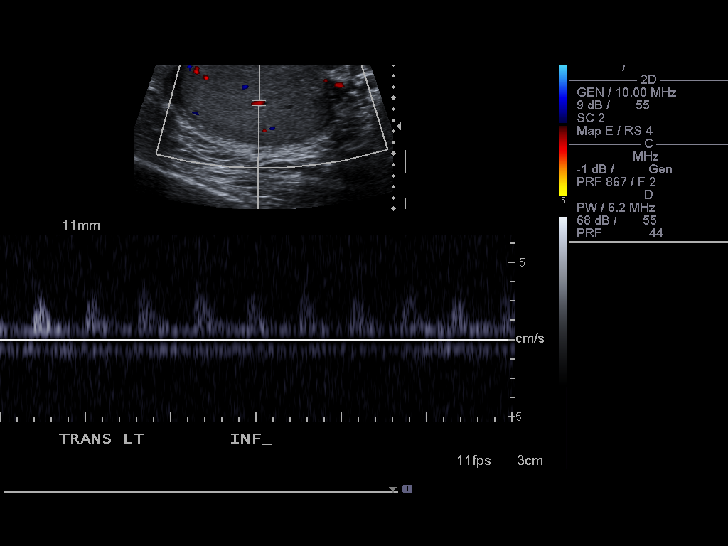
[im 41/45]
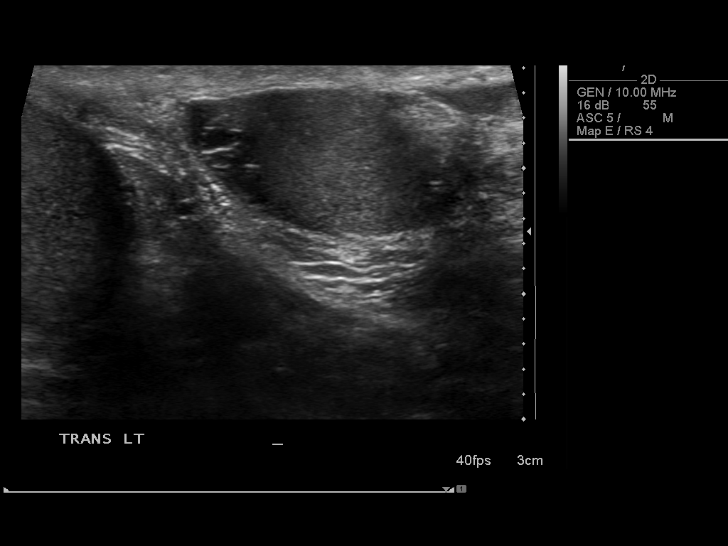
[im 45/45]
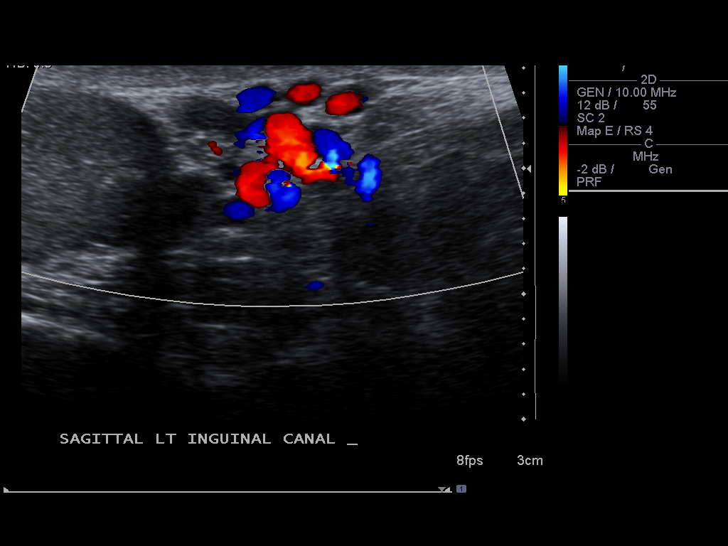

[14 of 25 positions shown; findings below may reference images not displayed]

FINDINGS: Right testis:  Normal in size and appearance, measuring 3.4 x 2.1 x
3.0 cm.

Left testis:  Normal in size and appearance, measuring 4.1 x 1.7 x
2.8 cm.

Right epididymis:  Notable for a 5 mm epididymal cyst.  Associated
5 x 4 x 3 mm epididymal appendix, without definite color Doppler
flow.

Left epididymis:  Notable for a 3 mm epididymal cyst.

Hydrocele:  Possible small right hydrocele.  Physiologic fluid on
the left.

Varicocele:  Absent.
IMPRESSION: Normal sonographic appearance of the bilateral testes.

No evidence of testicular torsion.

5 mm right epididymal appendix, without definite color Doppler
flow.  While torsed epididymal appendix cannot be excluded, this
condition is generally considered benign/self-limited.

## 2013-01-24 ENCOUNTER — Telehealth (HOSPITAL_COMMUNITY): Payer: Self-pay | Admitting: *Deleted

## 2013-01-24 NOTE — Telephone Encounter (Signed)
Error

## 2014-03-03 ENCOUNTER — Ambulatory Visit (INDEPENDENT_AMBULATORY_CARE_PROVIDER_SITE_OTHER): Payer: BC Managed Care – PPO | Admitting: Physician Assistant

## 2014-03-03 ENCOUNTER — Telehealth: Payer: Self-pay | Admitting: *Deleted

## 2014-03-03 VITALS — BP 116/80 | HR 78 | Temp 98.3°F | Resp 18 | Ht 69.0 in | Wt 177.2 lb

## 2014-03-03 DIAGNOSIS — T7840XA Allergy, unspecified, initial encounter: Secondary | ICD-10-CM

## 2014-03-03 DIAGNOSIS — R21 Rash and other nonspecific skin eruption: Secondary | ICD-10-CM

## 2014-03-03 DIAGNOSIS — L282 Other prurigo: Secondary | ICD-10-CM

## 2014-03-03 MED ORDER — LORATADINE 10 MG PO TABS: 10.0000 mg | ORAL_TABLET | Freq: Every day | ORAL | Status: DC

## 2014-03-03 MED ORDER — RANITIDINE HCL 150 MG PO TABS: 150.0000 mg | ORAL_TABLET | Freq: Two times a day (BID) | ORAL | Status: DC

## 2014-03-03 MED ORDER — CEPHALEXIN 500 MG PO CAPS: 500.0000 mg | ORAL_CAPSULE | Freq: Two times a day (BID) | ORAL | Status: DC

## 2014-03-03 MED ORDER — METHYLPREDNISOLONE SODIUM SUCC 125 MG IJ SOLR
125.0000 mg | Freq: Once | INTRAMUSCULAR | Status: AC
  Administered 2014-03-03: 125 mg via INTRAMUSCULAR

## 2014-03-03 MED ORDER — PREDNISONE 20 MG PO TABS: ORAL_TABLET | ORAL | Status: DC

## 2014-03-03 MED ORDER — METHYLPREDNISOLONE SODIUM SUCC 125 MG IJ SOLR: 125.0000 mg | Freq: Once | INTRAMUSCULAR | Status: DC

## 2014-03-03 NOTE — Patient Instructions (Signed)
Take Benadryl 25-50 mg at bedtime and Claritin (cetirizine) 1 tablet daily in the morning Zantac 150 mg twice daily.  Start prednisone tomorrow morning    Bee, Wasp, or Hornet Sting Your caregiver has diagnosed you as having an insect sting. An insect sting appears as a red lump in the skin that sometimes has a tiny hole in the center, or it may have a stinger in the center of the wound. The most common stings are from wasps, hornets and bees. Individuals have different reactions to insect stings.  A normal reaction may cause pain, swelling, and redness around the sting site.  A localized allergic reaction may cause swelling and redness that extends beyond the sting site.  A large local reaction may continue to develop over the next 12 to 36 hours.  On occasion, the reactions can be severe (anaphylactic reaction). An anaphylactic reaction may cause wheezing; difficulty breathing; chest pain; fainting; raised, itchy, red patches on the skin; a sick feeling to your stomach (nausea); vomiting; cramping; or diarrhea. If you have had an anaphylactic reaction to an insect sting in the past, you are more likely to have one again. HOME CARE INSTRUCTIONS   With bee stings, a small sac of poison is left in the wound. Brushing across this with something such as a credit card, or anything similar, will help remove this and decrease the amount of the reaction. This same procedure will not help a wasp sting as they do not leave behind a stinger and poison sac.  Apply a cold compress for 10 to 20 minutes every hour for 1 to 2 days, depending on severity, to reduce swelling and itching.  To lessen pain, a paste made of water and baking soda may be rubbed on the bite or sting and left on for 5 minutes.  To relieve itching and swelling, you may use take medication or apply medicated creams or lotions as directed.  Only take over-the-counter or prescription medicines for pain, discomfort, or fever as  directed by your caregiver.  Wash the sting site daily with soap and water. Apply antibiotic ointment on the sting site as directed.  If you suffered a severe reaction:  If you did not require hospitalization, an adult will need to stay with you for 24 hours in case the symptoms return.  You may need to wear a medical bracelet or necklace stating the allergy.  You and your family need to learn when and how to use an anaphylaxis kit or epinephrine injection.  If you have had a severe reaction before, always carry your anaphylaxis kit with you. SEEK MEDICAL CARE IF:   None of the above helps within 2 to 3 days.  The area becomes red, warm, tender, and swollen beyond the area of the bite or sting.  You have an oral temperature above 102 F (38.9 C). SEEK IMMEDIATE MEDICAL CARE IF:  You have symptoms of an allergic reaction which are:  Wheezing.  Difficulty breathing.  Chest pain.  Lightheadedness or fainting.  Itchy, raised, red patches on the skin.  Nausea, vomiting, cramping or diarrhea. ANY OF THESE SYMPTOMS MAY REPRESENT A SERIOUS PROBLEM THAT IS AN EMERGENCY. Do not wait to see if the symptoms will go away. Get medical help right away. Call your local emergency services (911 in [place] DO NOT drive yourself to the hospital. MAKE SURE YOU:   Understand these instructions.  Will watch your condition.  Will get help right away if you are not doing  well or get worse. Document Released: 06/27/2005 Document Revised: 09/19/2011 Document Reviewed: 12/12/2009 [facility] Patient Information 2015 [place], [place]. This information is not intended to replace advice given to you by your health care provider. Make sure you discuss any questions you have with your health care provider.

## 2014-03-03 NOTE — Telephone Encounter (Signed)
LM for pt to pick up work note if needed. In pick up drawer.

## 2014-03-03 NOTE — Progress Notes (Signed)
   Subjective:    Patient ID: William Nunez, male    DOB: 1955-10-07, 58 y.o.   MRN: [id number]  HPI 58 year old pleasant male presents for evaluation of a yellow jacket sting of his left lower leg.  Sting occurred on 8/22 while mowing the lawn.  Since then he has had swelling of his ankle and calf, erythema, and pain.  He has been using ice and elevating and has had no decrease in swelling.  Denies any lip/tongue swelling, SOB, trouble breathing, or hives.  No hx of anaphylaxis, but does have a hx of similar local reactions.   Denies fever, chills, nausea, vomiting, headache, or dizziness.     Review of Systems  Constitutional: Negative for fever and chills.  Gastrointestinal: Negative for nausea, vomiting and abdominal pain.  Skin: Positive for color change.  Neurological: Negative for dizziness and headaches.       Objective:   Physical Exam  Constitutional: He is oriented to person, place, and time. He appears well-developed and well-nourished.  HENT:  Head: Normocephalic and atraumatic.  Right Ear: External ear normal.  Left Ear: External ear normal.  Eyes: Conjunctivae are normal.  Neck: Normal range of motion.  Cardiovascular: Normal rate.   Pulses:      Dorsalis pedis pulses are 2+ on the right side, and 2+ on the left side.  Pulmonary/Chest: Effort normal.  Neurological: He is alert and oriented to person, place, and time.  Skin:     Neg Homan's sign. No pain in popliteal fossa.   Psychiatric: He has a normal mood and affect. His behavior is normal. Judgment and thought content normal.          Assessment & Plan:  Allergic reaction, initial encounter - Plan: predniSONE (DELTASONE) 20 MG tablet, methylPREDNISolone sodium succinate (SOLU-MEDROL) 125 mg/2 mL injection 125 mg, loratadine (CLARITIN) 10 MG tablet, ranitidine (ZANTAC) 150 MG tablet, DISCONTINUED: methylPREDNISolone sodium succinate (SOLU-MEDROL) 125 mg/2 mL injection 125 mg  Pruritic rash - Plan:  loratadine (CLARITIN) 10 MG tablet, ranitidine (ZANTAC) 150 MG tablet  Rash and nonspecific skin eruption - Plan: cephALEXin (KEFLEX) 500 MG capsule  Solumedrol 125 mg IM today.  Start prednisone taper tomorrow morning Benadryl 25-50 mg at bedtime, Claritin 10 mg daily in the morning, and Zantac 150 mg bid Continue to elevate as much as possible. Ice for 20- minutes 2-3 times daily.  RTC precautions discussed. F/u if symptoms worsening or fail to improve.

## 2014-03-15 ENCOUNTER — Ambulatory Visit (INDEPENDENT_AMBULATORY_CARE_PROVIDER_SITE_OTHER): Payer: BC Managed Care – PPO | Admitting: Emergency Medicine

## 2014-03-15 VITALS — BP 124/82 | HR 82 | Temp 98.6°F | Resp 16 | Ht 69.25 in | Wt 176.2 lb

## 2014-03-15 DIAGNOSIS — T63441S Toxic effect of venom of bees, accidental (unintentional), sequela: Secondary | ICD-10-CM

## 2014-03-15 DIAGNOSIS — T6591XS Toxic effect of unspecified substance, accidental (unintentional), sequela: Secondary | ICD-10-CM

## 2014-03-15 MED ORDER — EPINEPHRINE 0.3 MG/0.3ML IJ SOAJ: 0.3000 mg | Freq: Once | INTRAMUSCULAR | Status: AC

## 2014-03-15 NOTE — Patient Instructions (Signed)

## 2014-03-15 NOTE — Progress Notes (Signed)
   Subjective:    Patient ID: XZAVIEN HARADA, male    DOB: Mar 01, 1956, 58 y.o.   MRN: [id number]  This chart was scribed for Lesle Chris, MD by Jarvis Morgan, Medical Scribe. This patient was seen in Room 9 and the patient's care was started at 11:08 AM.  Chief Complaint  Patient presents with  . Follow-up    Yellow jacket sting, was stung in lower left calf, pain is not better    HPI HPI Comments: YOSMAR RYKER is a 58 y.o. male who presents to the Emergency Department for a follow up of a yellow jacket sting that occurred 2 weeks ago. He presented on 8/24 with left lower calf pain and increased gradually worsening swelling. He states that at the visit he was prescribed with antibiotics and antihistamines with no relief. He notes the pain has not gone away and that the site of the sting has continued to swell. He states he has had difficulty with bee stings in the past. He admits that exercising seems to cause inflamation of the site of the sting. He reports he has been icing and elevating the area with no relief. He denies any numbness, throat swelling, rash, chest pain, fever, chills, nausea, emesis, shortness of breath, weakness, gait problem.    Review of Systems  Constitutional: Negative for fever and chills.  HENT: Negative for trouble swallowing.   Respiratory: Negative for shortness of breath.   Cardiovascular: Negative for chest pain.  Gastrointestinal: Negative for nausea and vomiting.  Musculoskeletal: Positive for joint swelling (swelling of left calf). Negative for gait problem.  Skin: Negative for rash.       Bee sting to left calf 2 weeks ago  Neurological: Negative for weakness and numbness.       Objective:   Physical Exam CONSTITUTIONAL: Well developed/well nourished HEAD: Normocephalic/atraumatic EYES: EOMI/PERRL ENMT: Mucous membranes moist NECK: supple no meningeal signs SPINE:entire spine nontender CV: S1/S2 noted, no murmurs/rubs/gallops  noted LUNGS: Lungs are clear to auscultation bilaterally, no apparent distress ABDOMEN: soft, nontender, no rebound or guarding GU:no cva tenderness NEURO: Pt is awake/alert, moves all extremitiesx4 EXTREMITIES: pulses normal, full ROM. Inside of left lower leg is mild tenderness and swelling, no redness or nodules felt SKIN: warm, color. PSYCH: no abnormalities of mood noted      Assessment & Plan:  Local reaction to an insect staying. He will treat with ice Aleve and Zyrtec one a day. He was given a prescription for an EpiPen.I personally performed the services described in this documentation, which was scribed in my presence. The recorded information has been reviewed and is accurate.

## 2014-09-17 ENCOUNTER — Ambulatory Visit (INDEPENDENT_AMBULATORY_CARE_PROVIDER_SITE_OTHER): Payer: BLUE CROSS/BLUE SHIELD

## 2014-09-17 ENCOUNTER — Ambulatory Visit (INDEPENDENT_AMBULATORY_CARE_PROVIDER_SITE_OTHER): Payer: BLUE CROSS/BLUE SHIELD | Admitting: Family Medicine

## 2014-09-17 VITALS — BP 120/92 | HR 90 | Temp 98.2°F | Resp 18 | Wt 179.0 lb

## 2014-09-17 DIAGNOSIS — I10 Essential (primary) hypertension: Secondary | ICD-10-CM

## 2014-09-17 DIAGNOSIS — R059 Cough, unspecified: Secondary | ICD-10-CM

## 2014-09-17 DIAGNOSIS — R05 Cough: Secondary | ICD-10-CM | POA: Diagnosis not present

## 2014-09-17 MED ORDER — BENZONATATE 100 MG PO CAPS: 100.0000 mg | ORAL_CAPSULE | Freq: Three times a day (TID) | ORAL | Status: DC | PRN

## 2014-09-17 MED ORDER — ALBUTEROL SULFATE HFA 108 (90 BASE) MCG/ACT IN AERS: 2.0000 | INHALATION_SPRAY | Freq: Four times a day (QID) | RESPIRATORY_TRACT | Status: DC | PRN

## 2014-09-17 NOTE — Progress Notes (Addendum)
[facility] [address] [place] [address] [id number]  Date:  09/17/2014   Name:  William Nunez   DOB:  1956-01-27   MRN:  [id number]  PCP:  Johny Blamer, MD    Chief Complaint: Cough   History of Present Illness:  William Nunez is a 58 y.o. very pleasant male patient who presents with the following:  Generally healthy person here today with illness.   He has noted illness for 4 days.  He has a bad cough, is bringing up a lot of phlem, he is wheezing. He has not noted a fever at home.  He has used some OTC cough medication.  He did have some aches and chills- these are now better.   He has a ST from coughing, and a runny nose with clear discharge.   He had some post- tussive emesis but nothing separate.  No abdominal pain.   His wife is ill as well with similar sx, their son also seems to be getting ill.  Patient Active Problem List   Diagnosis Date Noted  . Umbilical hernia 06/16/2011  . right Inguinal strain 06/16/2011    Past Medical History  Diagnosis Date  . Hypertension   . Hypercholesteremia   . Right inguinal pain   . Depression     Past Surgical History  Procedure Laterality Date  . Tonsillectomy    . Lipoma excision      several  . Mandible surgery  2008    removal polyp    History  Substance Use Topics  . Smoking status: Never Smoker   . Smokeless tobacco: Never Used  . Alcohol Use: No    Family History  Problem Relation Age of Onset  . Heart attack Father   . Stroke Mother   . Diabetes Mother     Allergies  Allergen Reactions  . Hycodan [Hydrocodone-Homatropine] Nausea Only and Other (See Comments)    Made patient feel "out of it"  . Prozac [Fluoxetine Hcl] Other (See Comments)    Made patient feel "weird"  . Lisinopril     Patient unsure of reaction    Medication list has been reviewed and updated.  Current Outpatient Prescriptions on File Prior to Visit  Medication Sig Dispense Refill  .  AMBULATORY NON FORMULARY MEDICATION Place 1 application rectally 2 (two) times daily. Medication Name: Nifedipine 2% Ointment 30 g 1  . EPINEPHrine 0.3 mg/0.3 mL IJ SOAJ injection Inject 0.3 mLs (0.3 mg total) into the muscle once. 1 Device 11  . losartan (COZAAR) 25 MG tablet Take 25 mg by mouth daily.      . Multiple Vitamins-Minerals (MULTIVITAMIN PO) Take by mouth daily.       No current facility-administered medications on file prior to visit.    Review of Systems:  As per HPI- otherwise negative.   Physical Examination: Filed Vitals:   09/17/14 0842  BP: 120/92  Pulse: 90  Temp: 98.2 F (36.8 C)  Resp: 18   Filed Vitals:   09/17/14 0842  Weight: 179 lb (81.194 kg)   Body mass index is 26.24 kg/(m^2). Ideal Body Weight:    GEN: WDWN, NAD, Non-toxic, A & O x 3 HEENT: Atraumatic, Normocephalic. Neck supple. No masses, No LAD.  Bilateral TM wnl, oropharynx normal.  PEERL,EOMI.   Ears and Nose: No external deformity. CV: RRR, No M/G/R. No JVD. No thrill. No extra heart sounds. PULM: CTA B, no wheezes, crackles, rhonchi. No retractions.  No resp. distress. No accessory muscle use. EXTR: No c/c/e NEURO Normal gait.  PSYCH: Normally interactive. Conversant. Not depressed or anxious appearing.  Calm demeanor.   [facility] reading (PRIMARY) by  Dr. Patsy Lageropland. CXR: negative  CLINICAL DATA: Cough, malaise, history hypertension  EXAM: CHEST 2 VIEW  COMPARISON: None  FINDINGS: Upper normal heart size.  Mediastinal contours and pulmonary vascularity normal.  Lungs clear.  No pleural effusion or pneumothorax.  No acute osseous findings.  IMPRESSION: No acute abnormalities.           Assessment and Plan: Cough - Plan: DG Chest 2 View, benzonatate (TESSALON) 100 MG capsule, albuterol (PROVENTIL HFA;VENTOLIN HFA) 108 (90 BASE) MCG/ACT inhaler  Essential hypertension, benign  BP is well controlled.  Treat for viral URI with tessalon and albuterol as needed.  He  will let me know if not better in the next couple of days- Sooner if worse.      Signed Abbe AmsterdamJessica Logan Baltimore, MD

## 2014-09-17 NOTE — Patient Instructions (Signed)
It appears that you have a viral URI.  I think that you will continue to improve over the next few days- however if you do not please call me or come back in Use the tessalon perles as needed for cough, and the albuterol as needed for wheezing.

## 2016-02-04 IMAGING — CR DG CHEST 2V
2 series · 2 of 2 positions shown · non-contrast
Comparison: None

CLINICAL DATA: Cough, malaise, history hypertension

EXAM:
CHEST  2 VIEW

[PA]
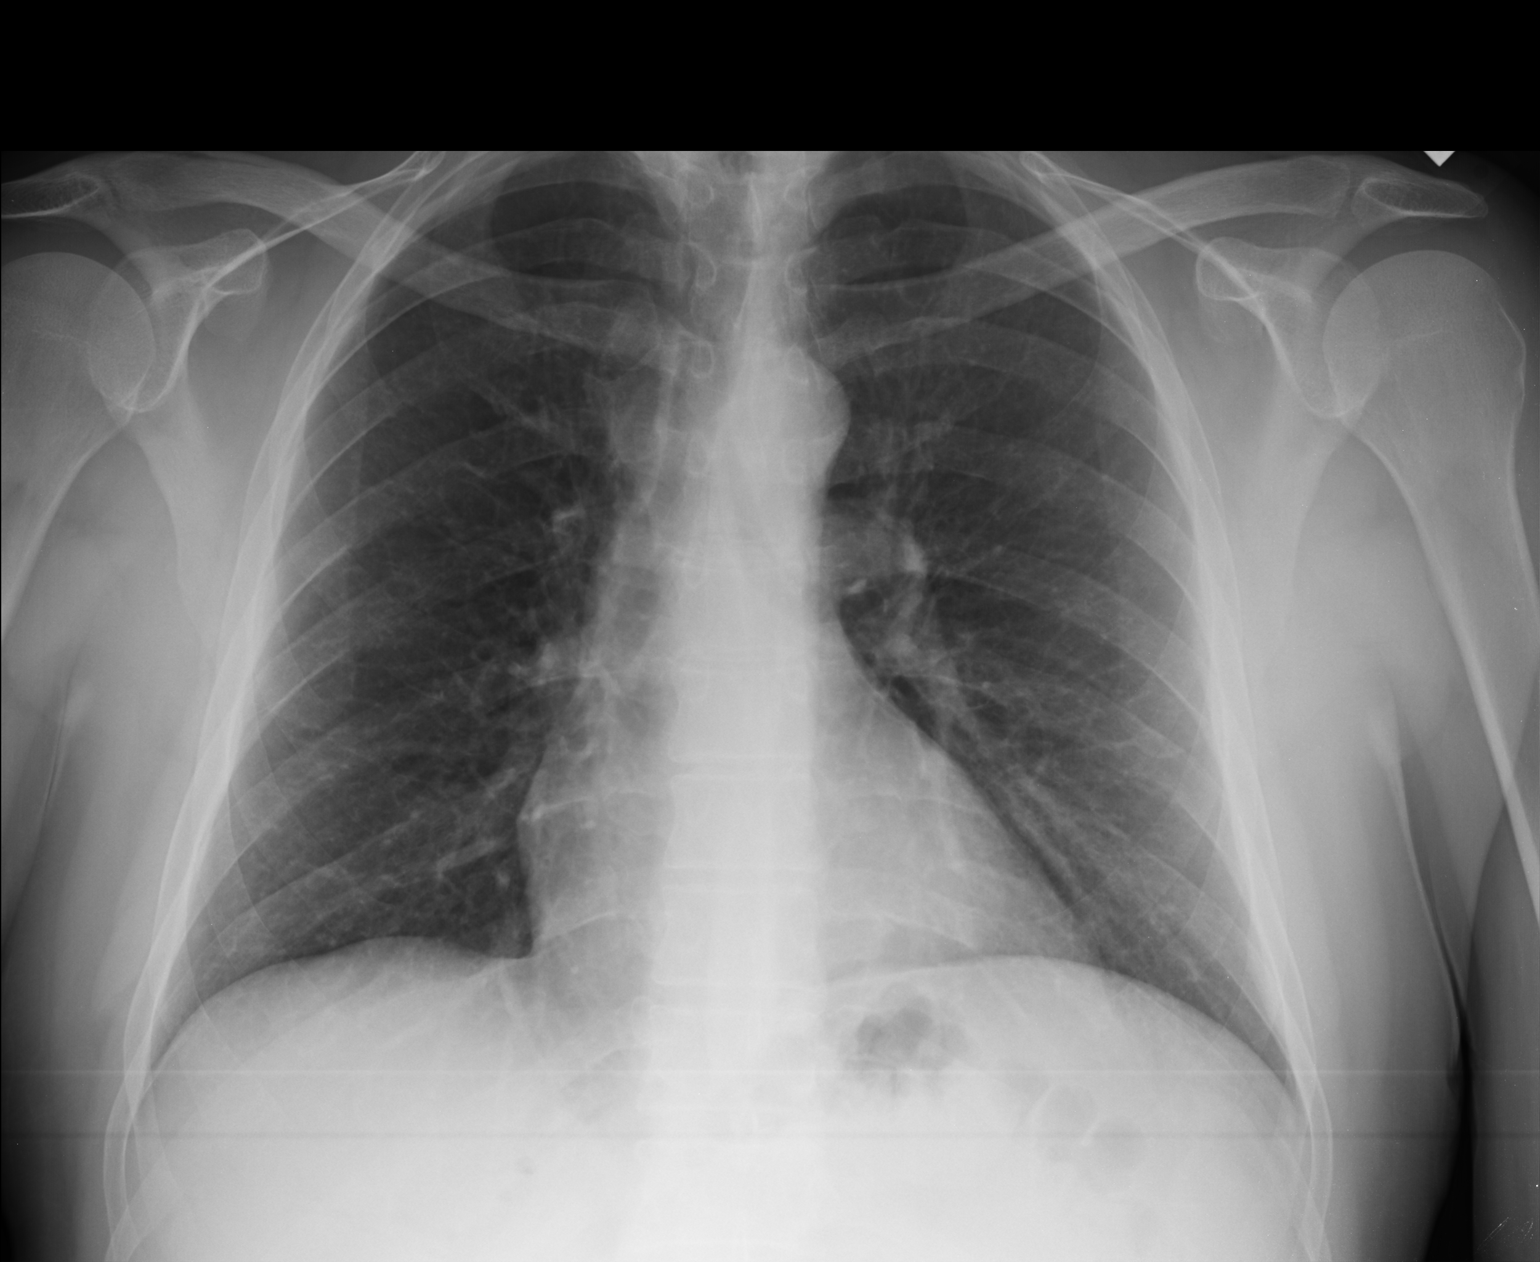

[lateral]
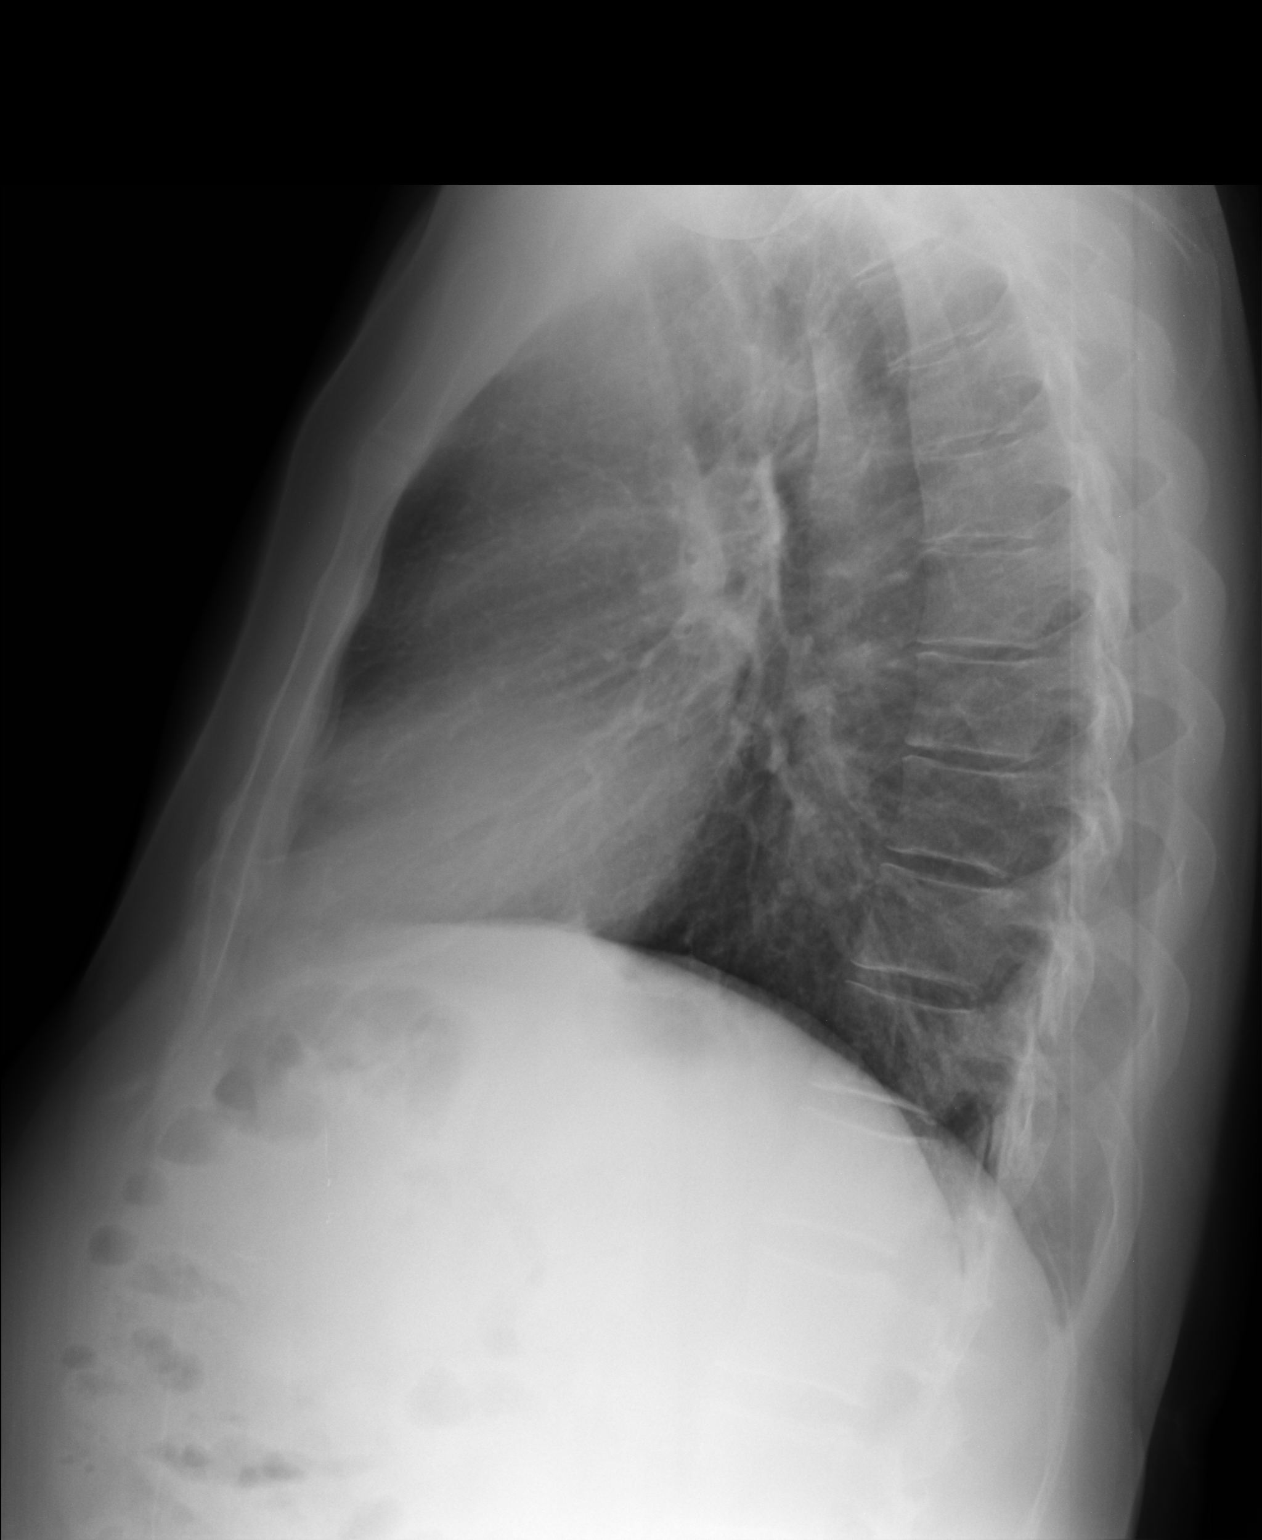

[2 of 2 positions shown; findings below may reference images not displayed]

FINDINGS: Upper normal heart size.

Mediastinal contours and pulmonary vascularity normal.

Lungs clear.

No pleural effusion or pneumothorax.

No acute osseous findings.
IMPRESSION: No acute abnormalities.

## 2016-02-19 ENCOUNTER — Other Ambulatory Visit: Payer: Self-pay | Admitting: Family Medicine

## 2016-02-19 ENCOUNTER — Ambulatory Visit
Admission: RE | Admit: 2016-02-19 | Discharge: 2016-02-19 | Disposition: A | Discharge status: Home / Self Care | Payer: 59 | Source: Ambulatory Visit | Attending: Family Medicine | Admitting: Family Medicine

## 2016-02-19 DIAGNOSIS — J189 Pneumonia, unspecified organism: Secondary | ICD-10-CM

## 2016-03-09 ENCOUNTER — Ambulatory Visit
Admission: RE | Admit: 2016-03-09 | Discharge: 2016-03-09 | Disposition: A | Discharge status: Home / Self Care | Payer: 59 | Source: Ambulatory Visit | Attending: Family Medicine | Admitting: Family Medicine

## 2016-03-09 ENCOUNTER — Other Ambulatory Visit: Payer: Self-pay | Admitting: Family Medicine

## 2016-03-09 DIAGNOSIS — J189 Pneumonia, unspecified organism: Secondary | ICD-10-CM

## 2016-03-21 ENCOUNTER — Other Ambulatory Visit: Payer: Self-pay | Admitting: Family Medicine

## 2016-03-21 DIAGNOSIS — R9389 Abnormal findings on diagnostic imaging of other specified body structures: Secondary | ICD-10-CM

## 2016-03-22 ENCOUNTER — Ambulatory Visit
Admission: RE | Admit: 2016-03-22 | Discharge: 2016-03-22 | Disposition: A | Discharge status: Home / Self Care | Payer: 59 | Source: Ambulatory Visit | Attending: Family Medicine | Admitting: Family Medicine

## 2016-03-22 DIAGNOSIS — R9389 Abnormal findings on diagnostic imaging of other specified body structures: Secondary | ICD-10-CM

## 2016-03-22 MED ORDER — IOPAMIDOL (ISOVUE-300) INJECTION 61%
75.0000 mL | Freq: Once | INTRAVENOUS | Status: AC | PRN
  Administered 2016-03-22: 75 mL via INTRAVENOUS

## 2016-05-14 ENCOUNTER — Ambulatory Visit (INDEPENDENT_AMBULATORY_CARE_PROVIDER_SITE_OTHER): Payer: 59 | Admitting: Physician Assistant

## 2016-05-14 ENCOUNTER — Ambulatory Visit (INDEPENDENT_AMBULATORY_CARE_PROVIDER_SITE_OTHER): Payer: 59

## 2016-05-14 VITALS — BP 126/88 | HR 74 | Temp 98.1°F | Resp 18 | Ht 69.25 in | Wt 180.0 lb

## 2016-05-14 DIAGNOSIS — R1110 Vomiting, unspecified: Secondary | ICD-10-CM

## 2016-05-14 DIAGNOSIS — Z711 Person with feared health complaint in whom no diagnosis is made: Secondary | ICD-10-CM

## 2016-05-14 DIAGNOSIS — R509 Fever, unspecified: Secondary | ICD-10-CM | POA: Diagnosis not present

## 2016-05-14 LAB — POCT CBC
Granulocyte percent: 70 %G (ref 37–80)
HCT, POC: 48.7 % (ref 43.5–53.7)
Hemoglobin: 17.3 g/dL (ref 14.1–18.1)
Lymph, poc: 2 (ref 0.6–3.4)
MCH, POC: 29.3 pg (ref 27–31.2)
MCHC: 35.6 g/dL — AB (ref 31.8–35.4)
MCV: 82.4 fL (ref 80–97)
MID (cbc): 0.5 (ref 0–0.9)
MPV: 7.1 fL (ref 0–99.8)
POC Granulocyte: 5.8 (ref 2–6.9)
POC LYMPH PERCENT: 23.8 %L (ref 10–50)
POC MID %: 6.2 %M (ref 0–12)
Platelet Count, POC: 165 10*3/uL (ref 142–424)
RBC: 5.91 M/uL (ref 4.69–6.13)
RDW, POC: 12.8 %
WBC: 8.3 10*3/uL (ref 4.6–10.2)

## 2016-05-14 NOTE — Patient Instructions (Addendum)
Please return to clinic if you are not better in 5-10 days. Supportive care with plenty of fluids and rest as needed.    Food Choices to Help Relieve Diarrhea, Adult When you have diarrhea, the foods you eat and your eating habits are very important. Choosing the right foods and drinks can help relieve diarrhea. Also, because diarrhea can last up to 7 days, you need to replace lost fluids and electrolytes (such as sodium, potassium, and chloride) in order to help prevent dehydration.  WHAT GENERAL GUIDELINES DO I NEED TO FOLLOW?  Slowly drink 1 cup (8 oz) of fluid for each episode of diarrhea. If you are getting enough fluid, your urine will be clear or pale yellow.  Eat starchy foods. Some good choices include white rice, white toast, pasta, low-fiber cereal, baked potatoes (without the skin), saltine crackers, and bagels.  Avoid large servings of any cooked vegetables.  Limit fruit to two servings per day. A serving is  cup or 1 small piece.  Choose foods with less than 2 g of fiber per serving.  Limit fats to less than 8 tsp (38 g) per day.  Avoid fried foods.  Eat foods that have probiotics in them. Probiotics can be found in certain dairy products.  Avoid foods and beverages that may increase the speed at which food moves through the stomach and intestines (gastrointestinal tract). Things to avoid include:  High-fiber foods, such as dried fruit, raw fruits and vegetables, nuts, seeds, and whole grain foods.  Spicy foods and high-fat foods.  Foods and beverages sweetened with high-fructose corn syrup, honey, or sugar alcohols such as xylitol, sorbitol, and mannitol. WHAT FOODS ARE RECOMMENDED? Grains White rice. White, [place]French, or pita breads (fresh or toasted), including plain rolls, buns, or bagels. White pasta. Saltine, soda, or graham crackers. Pretzels. Low-fiber cereal. Cooked cereals made with water (such as cornmeal, farina, or cream cereals). Plain muffins. Matzo. Melba  toast. Zwieback.  Vegetables Potatoes (without the skin). Strained tomato and vegetable juices. Most well-cooked and canned vegetables without seeds. Tender lettuce. Fruits Cooked or canned applesauce, apricots, cherries, fruit cocktail, grapefruit, peaches, pears, or plums. Fresh bananas, apples without skin, cherries, grapes, cantaloupe, grapefruit, peaches, oranges, or plums.  Meat and Other Protein Products Baked or boiled chicken. Eggs. Tofu. Fish. Seafood. Smooth peanut butter. Ground or well-cooked tender beef, ham, veal, lamb, pork, or poultry.  Dairy Plain yogurt, kefir, and unsweetened liquid yogurt. Lactose-free milk, buttermilk, or soy milk. Plain hard cheese. Beverages Sport drinks. Clear broths. Diluted fruit juices (except prune). Regular, caffeine-free sodas such as ginger ale. Water. Decaffeinated teas. Oral rehydration solutions. Sugar-free beverages not sweetened with sugar alcohols. Other Bouillon, broth, or soups made from recommended foods.  The items listed above may not be a complete list of recommended foods or beverages. Contact your dietitian for more options. WHAT FOODS ARE NOT RECOMMENDED? Grains Whole grain, whole wheat, bran, or rye breads, rolls, pastas, crackers, and cereals. Wild or brown rice. Cereals that contain more than 2 g of fiber per serving. Corn tortillas or taco shells. Cooked or dry oatmeal. Granola. Popcorn. Vegetables Raw vegetables. Cabbage, broccoli, Brussels sprouts, artichokes, baked beans, beet greens, corn, kale, legumes, peas, sweet potatoes, and yams. Potato skins. Cooked spinach and cabbage. Fruits Dried fruit, including raisins and dates. Raw fruits. Stewed or dried prunes. Fresh apples with skin, apricots, mangoes, pears, raspberries, and strawberries.  Meat and Other Protein Products Chunky peanut butter. Nuts and seeds. Beans and lentils. Tomasa BlaseBacon.  Dairy High-fat  cheeses. Milk, chocolate milk, and beverages made with milk, such as  milk shakes. Cream. Ice cream. Sweets and Desserts Sweet rolls, doughnuts, and sweet breads. Pancakes and waffles. Fats and Oils Butter. Cream sauces. Margarine. Salad oils. Plain salad dressings. Olives. Avocados.  Beverages Caffeinated beverages (such as coffee, tea, soda, or energy drinks). Alcoholic beverages. Fruit juices with pulp. Prune juice. Soft drinks sweetened with high-fructose corn syrup or sugar alcohols. Other Coconut. Hot sauce. Chili powder. Mayonnaise. Gravy. Cream-based or milk-based soups.  The items listed above may not be a complete list of foods and beverages to avoid. Contact your dietitian for more information. WHAT SHOULD I DO IF I BECOME DEHYDRATED? Diarrhea can sometimes lead to dehydration. Signs of dehydration include dark urine and dry mouth and skin. If you think you are dehydrated, you should rehydrate with an oral rehydration solution. These solutions can be purchased at pharmacies, retail stores, or online.  Drink -1 cup (120-240 mL) of oral rehydration solution each time you have an episode of diarrhea. If drinking this amount makes your diarrhea worse, try drinking smaller amounts more often. For example, drink 1-3 tsp (5-15 mL) every 5-10 minutes.  A general rule for staying hydrated is to drink 1-2 L of fluid per day. Talk to your health care provider about the specific amount you should be drinking each day. Drink enough fluids to keep your urine clear or pale yellow.   This information is not intended to replace advice given to you by your health care provider. Make sure you discuss any questions you have with your health care provider.   Document Released: 09/17/2003 Document Revised: 07/18/2014 Document Reviewed: 05/20/2013 Elsevier Interactive Patient Education 2016 [employer]   IF you received an x-ray today, you will receive an invoice from [facility]. Please contact [facility] at [phone] with questions or concerns  regarding your invoice.   IF you received labwork today, you will receive an invoice from [facility]. Please contact [facility] at [phone] with questions or concerns regarding your invoice.   Our billing staff will not be able to assist you with questions regarding bills from these companies.  You will be contacted with the lab results as soon as they are available. The fastest way to get your results is to activate your My Chart account. Instructions are located on the last page of this paperwork. If you have not heard from us regarding the results in 2 weeks, please contact this office.

## 2016-05-14 NOTE — Progress Notes (Signed)
Zollie ScaleMichael D Glassburn  MRN: [id number] DOB: 03/31/1956  PCP: Johny BlamerHARRIS, WILLIAM, MD  Subjective:  Pt is a 60 year old male, PMH HTN and umbilical hernia, who presents to clinic for concern for pneumonia. He is here today with his wife. Last night he felt feverish with chills and vomited. His wife notes "it was violent". Notes associated chest pain lower right side.  Denies cough, chest pain, chest tightness, calf pain, abdominal pain, and has not been immobile recently. Does not feel feverish today, notes he is feeling better. He is just concerned.   Reports a few episodes a few episodes of loose stools and one episode of diarrhea last week.   History of pneumonia - Was treated for pneumonia four months ago. States it was not going away, he received a CT scan which revealed pulmonary embolism. He is currently taking Eloquis daily.  He is concerned his pneumonia has returned.   PCP - Oneita Hurtandy [facility] on [address]   Review of Systems  Constitutional: Positive for chills and fever.  Respiratory: Negative for cough, chest tightness, shortness of breath and wheezing.   Cardiovascular: Negative for chest pain and palpitations.  Gastrointestinal: Positive for nausea and vomiting.  Psychiatric/Behavioral: Positive for sleep disturbance.    Patient Active Problem List   Diagnosis Date Noted  . Essential hypertension, benign 09/17/2014  . Umbilical hernia 06/16/2011  . right Inguinal strain 06/16/2011    Current Outpatient Prescriptions on File Prior to Visit  Medication Sig Dispense Refill  . albuterol (PROVENTIL HFA;VENTOLIN HFA) 108 (90 BASE) MCG/ACT inhaler Inhale 2 puffs into the lungs every 6 (six) hours as needed for wheezing or shortness of breath. 1 Inhaler 0  . AMBULATORY NON FORMULARY MEDICATION Place 1 application rectally 2 (two) times daily. Medication Name: Nifedipine 2% Ointment 30 g 1  . benzonatate (TESSALON) 100 MG capsule Take 1 capsule (100 mg total) by mouth 3 (three)  times daily as needed for cough. 40 capsule 0  . EPINEPHrine 0.3 mg/0.3 mL IJ SOAJ injection Inject 0.3 mLs (0.3 mg total) into the muscle once. 1 Device 11  . escitalopram (LEXAPRO) 10 MG tablet Take 10 mg by mouth daily.    Marland Kitchen. losartan (COZAAR) 25 MG tablet Take 25 mg by mouth daily.      . Multiple Vitamins-Minerals (MULTIVITAMIN PO) Take by mouth daily.       No current facility-administered medications on file prior to visit.     Allergies  Allergen Reactions  . Hycodan [Hydrocodone-Homatropine] Nausea Only and Other (See Comments)    Made patient feel "out of it"  . Prozac [Fluoxetine Hcl] Other (See Comments)    Made patient feel "weird"  . Lisinopril     Patient unsure of reaction    Objective:  BP 126/88 (BP Location: Right Arm, Patient Position: Sitting, Cuff Size: Small)   Pulse 74   Temp 98.1 F (36.7 C) (Oral)   Resp 18   Ht 5' 9.25" (1.759 m)   Wt 180 lb (81.6 kg)   SpO2 95%   BMI 26.39 kg/m   Physical Exam  Constitutional: He is oriented to person, place, and time and well-developed, well-nourished, and in no distress. He does not have a sickly appearance. No distress.  Cardiovascular: Normal rate, regular rhythm, S1 normal, S2 normal, normal heart sounds, intact distal pulses and normal pulses.  PMI is not displaced.   Pulmonary/Chest: Effort normal and breath sounds normal. No respiratory distress. He has no wheezes. He has  no rhonchi. He has no rales.  Neurological: He is alert and oriented to person, place, and time. GCS score is 15.  Skin: Skin is warm and dry.  Psychiatric: Mood, memory, affect and judgment normal.  Vitals reviewed.   Dg Chest 2 View  Result Date: 05/14/2016 CLINICAL DATA:  Fever and cough. EXAM: CHEST  2 VIEW COMPARISON:  03/09/2016 FINDINGS: The heart size and mediastinal contours are within normal limits. Both lungs are clear. No pleural effusion or pneumothorax. The visualized skeletal structures are unremarkable. IMPRESSION: No  active cardiopulmonary disease. Electronically Signed   By: Amie Portlandavid  Ormond M.D.   On: 05/14/2016 12:15   Results for orders placed or performed in visit on 05/14/16  POCT CBC  Result Value Ref Range   WBC 8.3 4.6 - 10.2 K/uL   Lymph, poc 2.0 0.6 - 3.4   POC LYMPH PERCENT 23.8 10 - 50 %L   MID (cbc) 0.5 0 - 0.9   POC MID % 6.2 0 - 12 %M   POC Granulocyte 5.8 2 - 6.9   Granulocyte percent 70.0 37 - 80 %G   RBC 5.91 4.69 - 6.13 M/uL   Hemoglobin 17.3 14.1 - 18.1 g/dL   HCT, POC 16.148.7 09.643.5 - 53.7 %   MCV 82.4 80 - 97 fL   MCH, POC 29.3 27 - 31.2 pg   MCHC 35.6 (A) 31.8 - 35.4 g/dL   RDW, POC 04.512.8 %   Platelet Count, POC 165 142 - 424 K/uL   MPV 7.1 0 - 99.8 fL   Assessment and Plan :  1. Fever, unspecified fever cause 2. Vomiting, intractability of vomiting not specified, presence of nausea not specified, unspecified vomiting type 3. Feared condition not demonstrated - POCT CBC - DG Chest 2 View; Future - Vital signs, history, physical exam, Chest x-ray and labs are not suggestive of pneumonia or PE . No concern for an acute process at this time. Supportive care encouraged: Eat bland foods for the next few days. RTC in 5-7 days if no improvement/symptoms worsen. Discussed with patient signs and symptoms of pneumonia. He understands and agrees with plan. Will return    Marco CollieWhitney Yesica Kemler, PA-C  [facility] 05/14/2016 11:50 AM

## 2017-08-09 DIAGNOSIS — J029 Acute pharyngitis, unspecified: Secondary | ICD-10-CM | POA: Diagnosis not present

## 2017-10-01 IMAGING — DX DG CHEST 2V
2 series · 2 of 2 positions shown · non-contrast
Comparison: 03/09/2016

CLINICAL DATA: Fever and cough.

EXAM:
CHEST  2 VIEW

[chest pa]
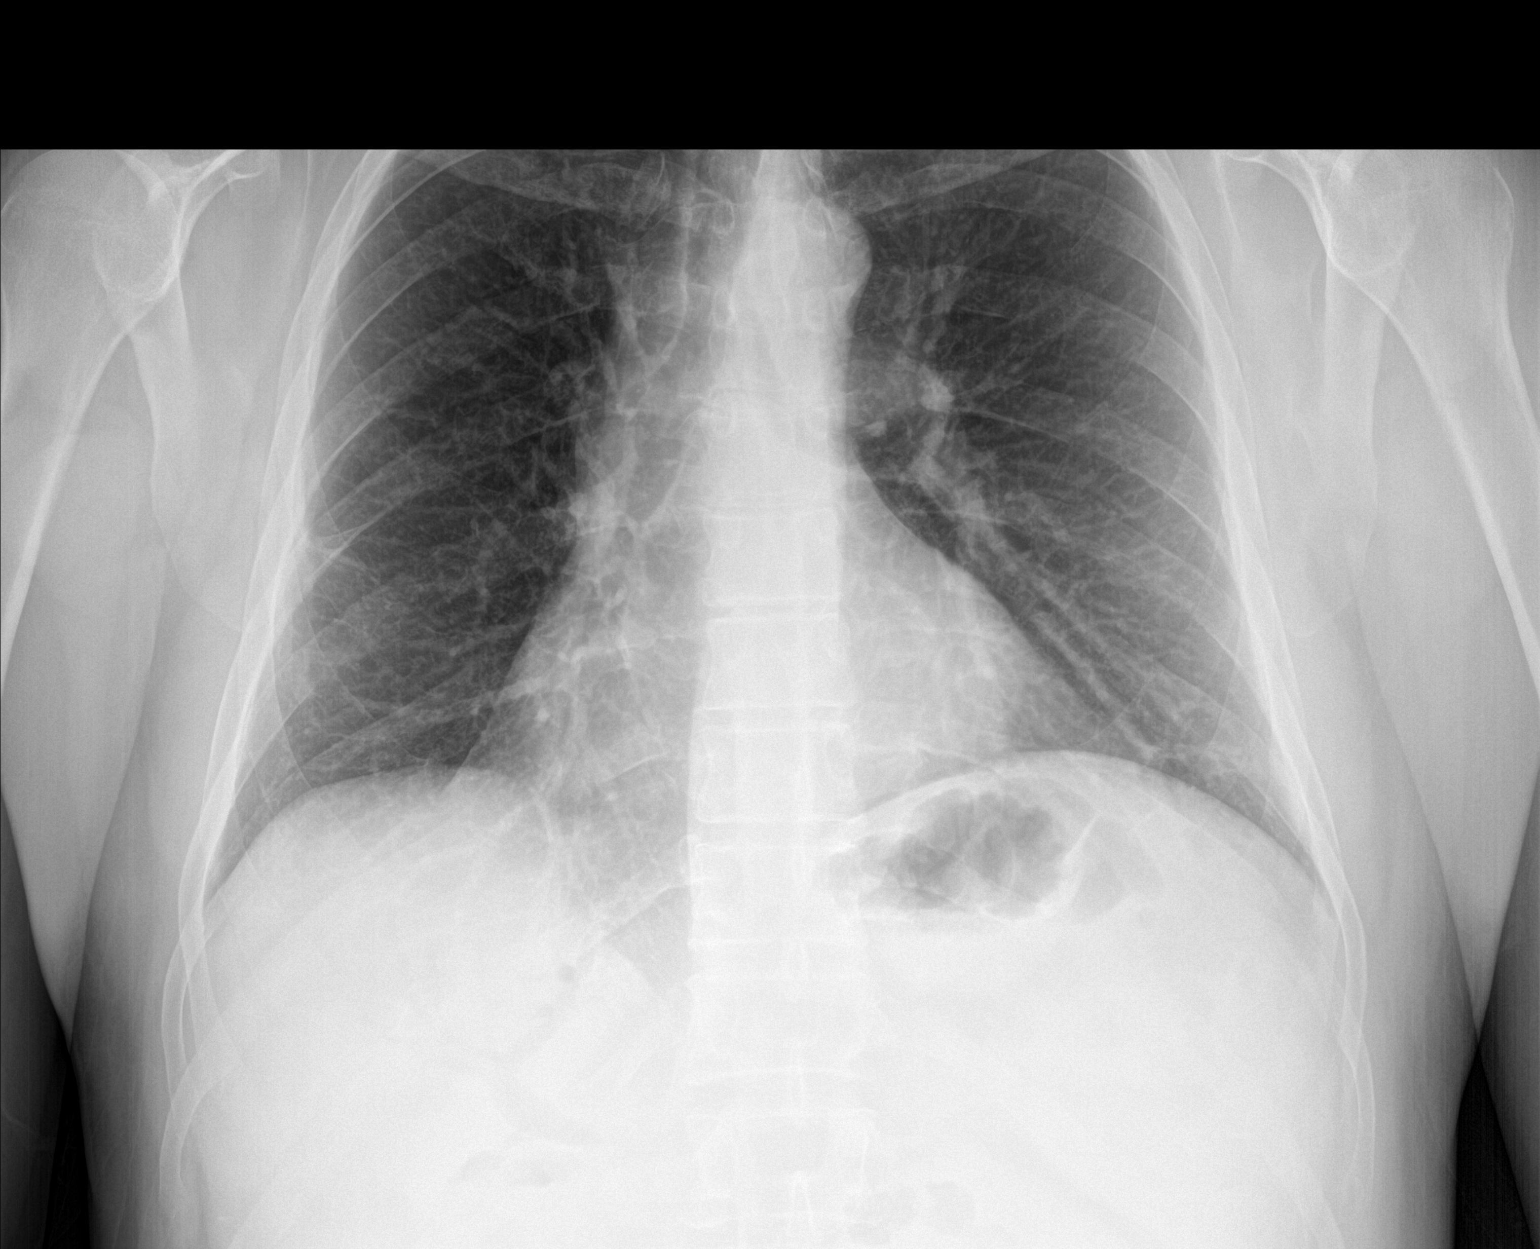

[chest lat]
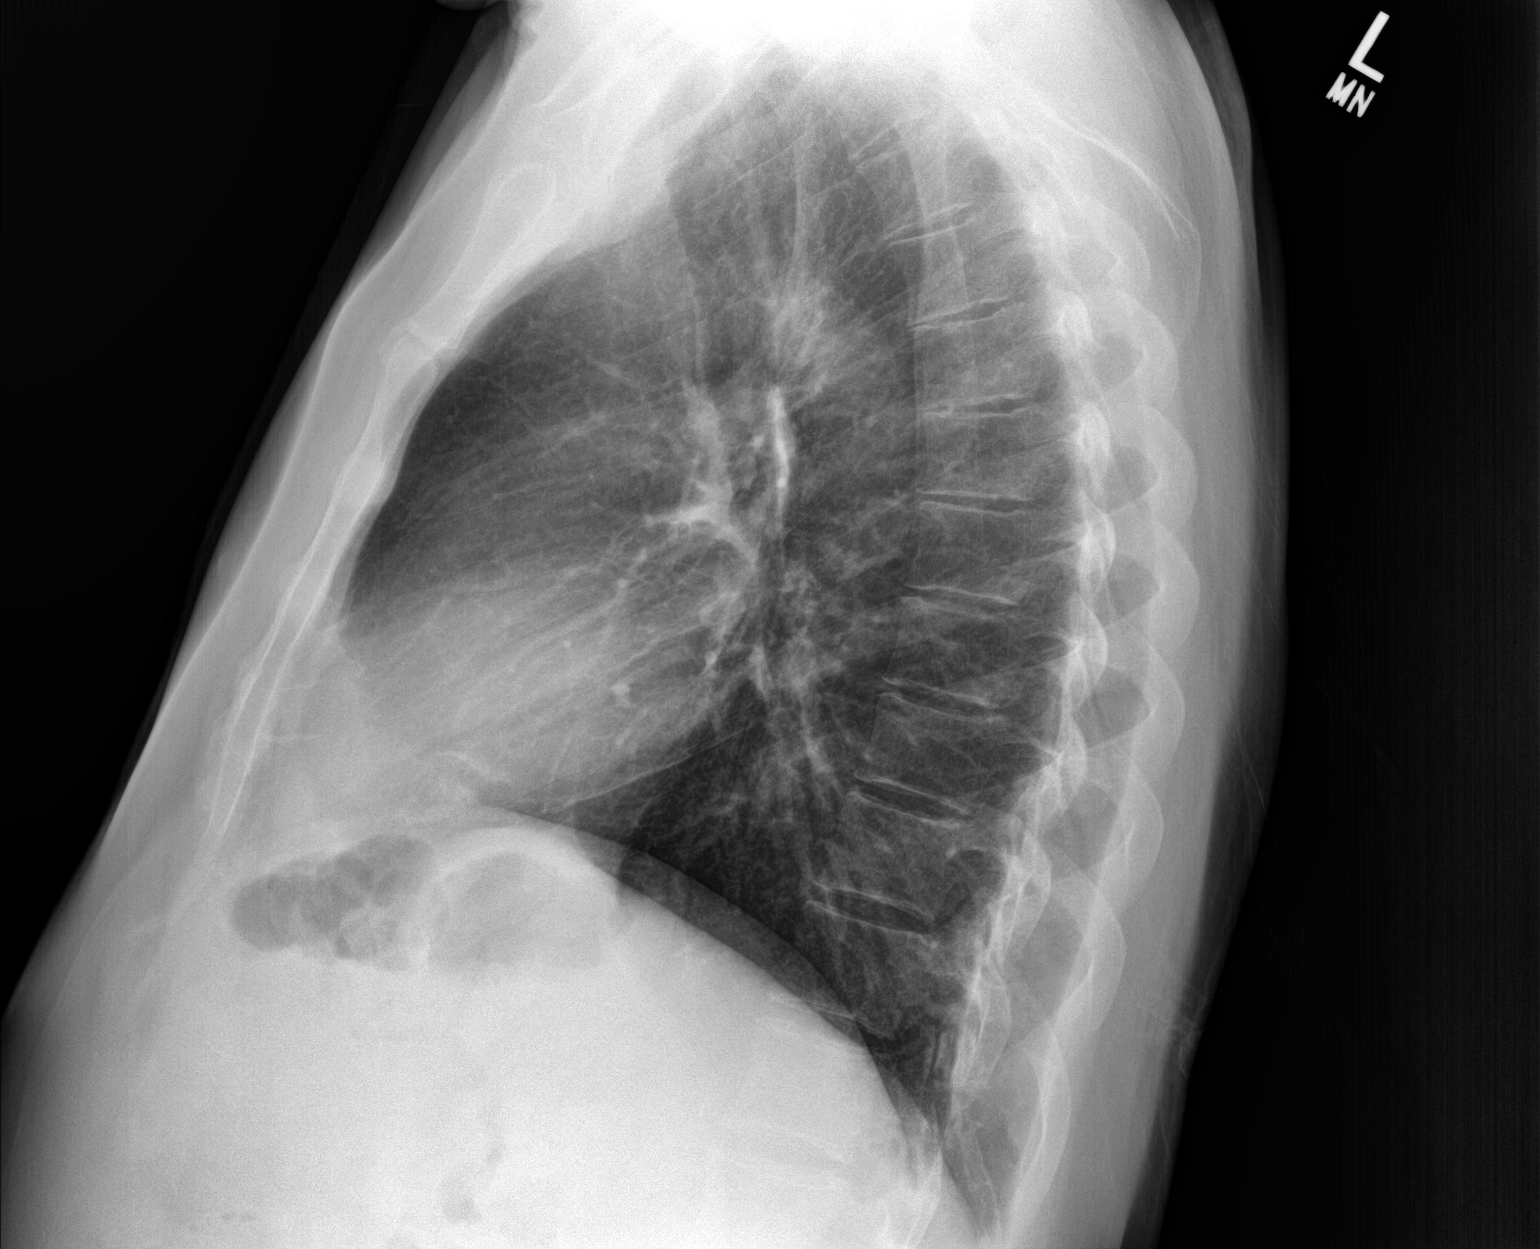

[2 of 2 positions shown; findings below may reference images not displayed]

FINDINGS: The heart size and mediastinal contours are within normal limits.
Both lungs are clear. No pleural effusion or pneumothorax. The
visualized skeletal structures are unremarkable.
IMPRESSION: No active cardiopulmonary disease.

## 2018-02-08 DIAGNOSIS — Z0000 Encounter for general adult medical examination without abnormal findings: Secondary | ICD-10-CM | POA: Diagnosis not present

## 2018-02-08 DIAGNOSIS — R739 Hyperglycemia, unspecified: Secondary | ICD-10-CM | POA: Diagnosis not present

## 2018-02-08 DIAGNOSIS — E7800 Pure hypercholesterolemia, unspecified: Secondary | ICD-10-CM | POA: Diagnosis not present

## 2018-02-08 DIAGNOSIS — Z125 Encounter for screening for malignant neoplasm of prostate: Secondary | ICD-10-CM | POA: Diagnosis not present

## 2018-02-08 DIAGNOSIS — R6882 Decreased libido: Secondary | ICD-10-CM | POA: Diagnosis not present

## 2018-02-08 DIAGNOSIS — I10 Essential (primary) hypertension: Secondary | ICD-10-CM | POA: Diagnosis not present

## 2018-02-28 ENCOUNTER — Telehealth: Payer: Self-pay | Admitting: Oncology

## 2018-02-28 ENCOUNTER — Encounter: Payer: Self-pay | Admitting: Oncology

## 2018-02-28 NOTE — Telephone Encounter (Signed)
Pt returned my call to schedule a hematology appt. Hematology referral received from Dr. Johny BlamerWilliam Nunez for hx of pe. Pt has been scheduled to see Dr. Clelia CroftShadad on 9/17 at 2pm. Letter mailed.

## 2018-03-07 DIAGNOSIS — E291 Testicular hypofunction: Secondary | ICD-10-CM | POA: Diagnosis not present

## 2018-03-27 ENCOUNTER — Inpatient Hospital Stay: Payer: BLUE CROSS/BLUE SHIELD | Attending: Oncology | Admitting: Oncology

## 2018-03-27 VITALS — BP 133/82 | HR 77 | Temp 98.3°F | Resp 17 | Ht 69.25 in | Wt 189.0 lb

## 2018-03-27 DIAGNOSIS — F329 Major depressive disorder, single episode, unspecified: Secondary | ICD-10-CM | POA: Diagnosis not present

## 2018-03-27 DIAGNOSIS — Z79899 Other long term (current) drug therapy: Secondary | ICD-10-CM | POA: Diagnosis not present

## 2018-03-27 DIAGNOSIS — Z8672 Personal history of thrombophlebitis: Secondary | ICD-10-CM | POA: Diagnosis not present

## 2018-03-27 DIAGNOSIS — Z7901 Long term (current) use of anticoagulants: Secondary | ICD-10-CM

## 2018-03-27 DIAGNOSIS — I2782 Chronic pulmonary embolism: Secondary | ICD-10-CM | POA: Diagnosis not present

## 2018-03-27 DIAGNOSIS — I10 Essential (primary) hypertension: Secondary | ICD-10-CM

## 2018-03-27 DIAGNOSIS — E7800 Pure hypercholesterolemia, unspecified: Secondary | ICD-10-CM | POA: Diagnosis not present

## 2018-03-27 NOTE — Progress Notes (Signed)
Reason for the request:   Pulmonary embolism  HPI: I was asked by Dr. Dr. Tiburcio PeaHarris  to evaluate William Nunez for history of pulmonary embolism.  He is a 62 year old man with history of hypertension and hyperlipidemia was diagnosed with pulmonary embolism in 2017.  At that time he presented with symptoms of shortness of breath and diagnosed with bilateral pneumonia in August 2017.  He had a repeat CT scan on 03/22/2016 which showed bilateral pulmonary emboli with moderate burden.  He has been on Eliquis since that time.  He denies any provoking factors leading up to that episode other than his recent infection.  He denies any long car rides or travel.  He denies any orthopedic surgery.  He did have a history of superficial phlebitis dating back in 2013.  He did have a hypercoagulable work-up done at the time which suggested protein C deficiency.  Clinically, he reports no recent issues related to Eliquis.  He denies any bleeding including epistaxis, hematochezia or melena.  Has performance status activity level remain excellent.  Continues to work full-time.  He does not report any headaches, blurry vision, syncope or seizures. Does not report any fevers, chills or sweats.  Does not report any cough, wheezing or hemoptysis.  Does not report any chest pain, palpitation, orthopnea or leg edema.  Does not report any nausea, vomiting or abdominal pain.  Does not report any constipation or diarrhea.  Does not report any skeletal complaints.    Does not report frequency, urgency or hematuria.  Does not report any skin rashes or lesions. Does not report any heat or cold intolerance.  Does not report any lymphadenopathy or petechiae.  Does not report any anxiety or depression.  Remaining review of systems is negative.    Past Medical History:  Diagnosis Date  . Depression   . Hypercholesteremia   . Hypertension   . Right inguinal pain   :  Past Surgical History:  Procedure Laterality Date  . LIPOMA EXCISION      several  . MANDIBLE SURGERY  2008   removal polyp  . TONSILLECTOMY    :   Current Outpatient Medications:  .  albuterol (PROVENTIL HFA;VENTOLIN HFA) 108 (90 BASE) MCG/ACT inhaler, Inhale 2 puffs into the lungs every 6 (six) hours as needed for wheezing or shortness of breath., Disp: 1 Inhaler, Rfl: 0 .  AMBULATORY NON FORMULARY MEDICATION, Place 1 application rectally 2 (two) times daily. Medication Name: Nifedipine 2% Ointment, Disp: 30 g, Rfl: 1 .  benzonatate (TESSALON) 100 MG capsule, Take 1 capsule (100 mg total) by mouth 3 (three) times daily as needed for cough., Disp: 40 capsule, Rfl: 0 .  EPINEPHrine 0.3 mg/0.3 mL IJ SOAJ injection, Inject 0.3 mLs (0.3 mg total) into the muscle once., Disp: 1 Device, Rfl: 11 .  escitalopram (LEXAPRO) 10 MG tablet, Take 10 mg by mouth daily., Disp: , Rfl:  .  losartan (COZAAR) 25 MG tablet, Take 25 mg by mouth daily.  , Disp: , Rfl:  .  Multiple Vitamins-Minerals (MULTIVITAMIN PO), Take by mouth daily.  , Disp: , Rfl: :  Allergies  Allergen Reactions  . Hycodan [Hydrocodone-Homatropine] Nausea Only and Other (See Comments)    Made patient feel "out of it"  . Prozac [Fluoxetine Hcl] Other (See Comments)    Made patient feel "weird"  . Lisinopril     Patient unsure of reaction  :  Family History  Problem Relation Age of Onset  . Stroke Mother   .  Diabetes Mother   . Heart attack Father   :  Social History   Socioeconomic History  . Marital status: Married    Spouse name: Not on file  . Number of children: Not on file  . Years of education: Not on file  . Highest education level: Not on file  Occupational History  . Not on file  Social Needs  . Financial resource strain: Not on file  . Food insecurity:    Worry: Not on file    Inability: Not on file  . Transportation needs:    Medical: Not on file    Non-medical: Not on file  Tobacco Use  . Smoking status: Never Smoker  . Smokeless tobacco: Never Used  Substance and  Sexual Activity  . Alcohol use: No  . Drug use: No  . Sexual activity: Not on file  Lifestyle  . Physical activity:    Days per week: Not on file    Minutes per session: Not on file  . Stress: Not on file  Relationships  . Social connections:    Talks on phone: Not on file    Gets together: Not on file    Attends religious service: Not on file    Active member of club or organization: Not on file    Attends meetings of clubs or organizations: Not on file    Relationship status: Not on file  . Intimate partner violence:    Fear of current or ex partner: Not on file    Emotionally abused: Not on file    Physically abused: Not on file    Forced sexual activity: Not on file  Other Topics Concern  . Not on file  Social History Narrative  . Not on file  :  Pertinent items are noted in HPI.  Exam:  ECOG 1 General appearance: alert and cooperative appeared without distress. Head: atraumatic without any abnormalities. Eyes: conjunctivae/corneas clear. PERRL.  Sclera anicteric. Throat: lips, mucosa, and tongue normal; without oral thrush or ulcers. Resp: clear to auscultation bilaterally without rhonchi, wheezes or dullness to percussion. Cardio: regular rate and rhythm, S1, S2 normal, no murmur, click, rub or gallop GI: soft, non-tender; bowel sounds normal; no masses,  no organomegaly Skin: Skin color, texture, turgor normal. No rashes or lesions Lymph nodes: Cervical, supraclavicular, and axillary nodes normal. Neurologic: Grossly normal without any motor, sensory or deep tendon reflexes. Musculoskeletal: No joint deformity or effusion.  CBC    Component Value Date/Time   WBC 8.3 05/14/2016 1211   RBC 5.91 05/14/2016 1211   HGB 17.3 05/14/2016 1211   HCT 48.7 05/14/2016 1211   MCV 82.4 05/14/2016 1211   MCH 29.3 05/14/2016 1211   MCHC 35.6 (A) 05/14/2016 1211      Assessment and Plan:   62 year old man with the following:  1.  Pulmonary embolism diagnosed in  September 2017.  It appears to be unprovoked although he did have a recent pneumonia and slight immobility associated with it.  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  He has been on Eliquis since that time.  The natural course of this disease was reviewed today with the patient in detail.  Inherited and acquired thrombophilia was also discussed as well as treatment options.  Risks and benefits of continuing long-term anticoagulation with Eliquis was reviewed today.  Given the fact that he had a reasonably unprovoked pulmonary embolism, history of recurrent phlebitis and possibility of inherited thrombophilia he is at high  risk of developing recurrent thrombosis off anticoagulation.  Long-term anticoagulation risks include increased risk of bleeding which include GI bleeding and rarely CNS hemorrhage.  I also offered to repeat his hypercoagulable panel to confirm his inherited or acquired thrombophilia but it will not affect our recommendation at this time.  This will not affect our management option at this time and recommendation.  After discussion today, I recommended continuing Eliquis for life unless he develops any recurrent bleeding issues in the future.  At that time I would repeat a hypercoagulable panel and reassess him at the time.  All his questions were answered to his satisfaction.   2.  Follow-up: I am happy to see him in the future as needed.  30  minutes was spent with the patient face-to-face today.  More than 50% of time was dedicated to reviewing the natural course of his disease, reviewing imaging studies and discussing treatment options.   Thank you for the referral.  A copy of this consult has been forwarded to the requesting physician.

## 2018-03-28 ENCOUNTER — Telehealth: Payer: Self-pay

## 2018-03-28 NOTE — Telephone Encounter (Signed)
Per 9/18 no los °

## 2018-06-05 DIAGNOSIS — M25662 Stiffness of left knee, not elsewhere classified: Secondary | ICD-10-CM | POA: Diagnosis not present

## 2018-06-05 DIAGNOSIS — M24562 Contracture, left knee: Secondary | ICD-10-CM | POA: Diagnosis not present

## 2018-06-05 DIAGNOSIS — M25462 Effusion, left knee: Secondary | ICD-10-CM | POA: Diagnosis not present

## 2018-06-05 DIAGNOSIS — M25562 Pain in left knee: Secondary | ICD-10-CM | POA: Diagnosis not present

## 2018-06-11 DIAGNOSIS — M24562 Contracture, left knee: Secondary | ICD-10-CM | POA: Diagnosis not present

## 2018-06-11 DIAGNOSIS — M25562 Pain in left knee: Secondary | ICD-10-CM | POA: Diagnosis not present

## 2018-06-11 DIAGNOSIS — M25662 Stiffness of left knee, not elsewhere classified: Secondary | ICD-10-CM | POA: Diagnosis not present

## 2018-06-11 DIAGNOSIS — M25462 Effusion, left knee: Secondary | ICD-10-CM | POA: Diagnosis not present

## 2018-06-13 DIAGNOSIS — M25662 Stiffness of left knee, not elsewhere classified: Secondary | ICD-10-CM | POA: Diagnosis not present

## 2018-06-13 DIAGNOSIS — M25462 Effusion, left knee: Secondary | ICD-10-CM | POA: Diagnosis not present

## 2018-06-13 DIAGNOSIS — M24562 Contracture, left knee: Secondary | ICD-10-CM | POA: Diagnosis not present

## 2018-06-13 DIAGNOSIS — M25562 Pain in left knee: Secondary | ICD-10-CM | POA: Diagnosis not present

## 2019-09-09 DIAGNOSIS — Z0000 Encounter for general adult medical examination without abnormal findings: Secondary | ICD-10-CM | POA: Diagnosis not present

## 2019-09-09 DIAGNOSIS — Z125 Encounter for screening for malignant neoplasm of prostate: Secondary | ICD-10-CM | POA: Diagnosis not present

## 2019-09-09 DIAGNOSIS — E7800 Pure hypercholesterolemia, unspecified: Secondary | ICD-10-CM | POA: Diagnosis not present

## 2019-09-09 DIAGNOSIS — R7303 Prediabetes: Secondary | ICD-10-CM | POA: Diagnosis not present

## 2019-09-09 DIAGNOSIS — I10 Essential (primary) hypertension: Secondary | ICD-10-CM | POA: Diagnosis not present

## 2019-12-20 DIAGNOSIS — L600 Ingrowing nail: Secondary | ICD-10-CM | POA: Diagnosis not present

## 2020-07-18 DIAGNOSIS — U071 COVID-19: Secondary | ICD-10-CM | POA: Diagnosis not present

## 2020-09-10 DIAGNOSIS — Z125 Encounter for screening for malignant neoplasm of prostate: Secondary | ICD-10-CM | POA: Diagnosis not present

## 2020-09-10 DIAGNOSIS — E7800 Pure hypercholesterolemia, unspecified: Secondary | ICD-10-CM | POA: Diagnosis not present

## 2020-09-10 DIAGNOSIS — R7303 Prediabetes: Secondary | ICD-10-CM | POA: Diagnosis not present

## 2020-09-10 DIAGNOSIS — I10 Essential (primary) hypertension: Secondary | ICD-10-CM | POA: Diagnosis not present

## 2020-09-10 DIAGNOSIS — Z0000 Encounter for general adult medical examination without abnormal findings: Secondary | ICD-10-CM | POA: Diagnosis not present

## 2021-09-24 DIAGNOSIS — R7303 Prediabetes: Secondary | ICD-10-CM | POA: Diagnosis not present

## 2021-09-24 DIAGNOSIS — Z0000 Encounter for general adult medical examination without abnormal findings: Secondary | ICD-10-CM | POA: Diagnosis not present

## 2021-09-24 DIAGNOSIS — Z125 Encounter for screening for malignant neoplasm of prostate: Secondary | ICD-10-CM | POA: Diagnosis not present

## 2021-09-24 DIAGNOSIS — E7800 Pure hypercholesterolemia, unspecified: Secondary | ICD-10-CM | POA: Diagnosis not present

## 2021-09-24 DIAGNOSIS — I10 Essential (primary) hypertension: Secondary | ICD-10-CM | POA: Diagnosis not present

## 2022-05-03 DIAGNOSIS — Z7901 Long term (current) use of anticoagulants: Secondary | ICD-10-CM | POA: Diagnosis not present

## 2022-06-28 DIAGNOSIS — Z1152 Encounter for screening for COVID-19: Secondary | ICD-10-CM | POA: Diagnosis not present

## 2022-06-28 DIAGNOSIS — J101 Influenza due to other identified influenza virus with other respiratory manifestations: Secondary | ICD-10-CM | POA: Diagnosis not present

## 2022-06-28 DIAGNOSIS — R059 Cough, unspecified: Secondary | ICD-10-CM | POA: Diagnosis not present

## 2022-07-08 DIAGNOSIS — R110 Nausea: Secondary | ICD-10-CM | POA: Diagnosis not present

## 2022-07-08 DIAGNOSIS — J0100 Acute maxillary sinusitis, unspecified: Secondary | ICD-10-CM | POA: Diagnosis not present

## 2022-08-03 DIAGNOSIS — K5730 Diverticulosis of large intestine without perforation or abscess without bleeding: Secondary | ICD-10-CM | POA: Diagnosis not present

## 2022-08-03 DIAGNOSIS — Z09 Encounter for follow-up examination after completed treatment for conditions other than malignant neoplasm: Secondary | ICD-10-CM | POA: Diagnosis not present

## 2022-08-03 DIAGNOSIS — K648 Other hemorrhoids: Secondary | ICD-10-CM | POA: Diagnosis not present

## 2022-08-03 DIAGNOSIS — Z86010 Personal history of colonic polyps: Secondary | ICD-10-CM | POA: Diagnosis not present

## 2022-08-03 DIAGNOSIS — K635 Polyp of colon: Secondary | ICD-10-CM | POA: Diagnosis not present
